# Patient Record
Sex: Male | Born: 1960
Health system: Southern US, Community
[De-identification: ages and names within clinical notes are randomized; demographics above are authoritative.]

## PROBLEM LIST (undated history)

## (undated) DIAGNOSIS — I38 Endocarditis, valve unspecified: Secondary | ICD-10-CM

## (undated) DIAGNOSIS — N200 Calculus of kidney: Secondary | ICD-10-CM

## (undated) DIAGNOSIS — I10 Essential (primary) hypertension: Secondary | ICD-10-CM

## (undated) DIAGNOSIS — E785 Hyperlipidemia, unspecified: Secondary | ICD-10-CM

## (undated) HISTORY — PX: APPENDECTOMY: SHX54

## (undated) HISTORY — DX: Endocarditis, valve unspecified: I38

## (undated) HISTORY — PX: ORIF ZYGOMATIC FRACTURE: SHX2134

## (undated) HISTORY — DX: Essential (primary) hypertension: I10

## (undated) HISTORY — DX: Hyperlipidemia, unspecified: E78.5

## (undated) HISTORY — DX: Calculus of kidney: N20.0

---

## 1998-10-20 ENCOUNTER — Emergency Department (HOSPITAL_COMMUNITY): Admission: EM | Admit: 1998-10-20 | Discharge: 1998-10-21 | Payer: Self-pay

## 1998-10-20 ENCOUNTER — Encounter: Payer: Self-pay | Admitting: Emergency Medicine

## 2003-12-02 ENCOUNTER — Emergency Department (HOSPITAL_COMMUNITY): Admission: EM | Admit: 2003-12-02 | Discharge: 2003-12-02 | Payer: Self-pay | Admitting: Emergency Medicine

## 2009-01-29 ENCOUNTER — Emergency Department: Payer: Self-pay | Admitting: Unknown Physician Specialty

## 2009-02-23 ENCOUNTER — Emergency Department: Payer: Self-pay | Admitting: Emergency Medicine

## 2009-02-26 ENCOUNTER — Emergency Department: Payer: Self-pay | Admitting: Unknown Physician Specialty

## 2009-03-03 ENCOUNTER — Ambulatory Visit: Payer: Self-pay | Admitting: Urology

## 2009-03-06 ENCOUNTER — Emergency Department: Payer: Self-pay | Admitting: Emergency Medicine

## 2011-05-31 ENCOUNTER — Emergency Department: Payer: Self-pay | Admitting: *Deleted

## 2011-05-31 LAB — COMPREHENSIVE METABOLIC PANEL
Albumin: 3.6 g/dL (ref 3.4–5.0)
Alkaline Phosphatase: 77 U/L (ref 50–136)
Anion Gap: 14 (ref 7–16)
BUN: 14 mg/dL (ref 7–18)
Bilirubin,Total: 0.3 mg/dL (ref 0.2–1.0)
Calcium, Total: 9.1 mg/dL (ref 8.5–10.1)
Chloride: 109 mmol/L — ABNORMAL HIGH (ref 98–107)
Co2: 20 mmol/L — ABNORMAL LOW (ref 21–32)
Creatinine: 1.07 mg/dL (ref 0.60–1.30)
EGFR (African American): >60
EGFR (Non-African Amer.): >60
Glucose: 79 mg/dL (ref 65–99)
Osmolality: 284 (ref 275–301)
Potassium: 4 mmol/L (ref 3.5–5.1)
SGOT(AST): 24 U/L (ref 15–37)
SGPT (ALT): 23 U/L
Sodium: 143 mmol/L (ref 136–145)
Total Protein: 7.7 g/dL (ref 6.4–8.2)

## 2011-05-31 LAB — CBC
HCT: 41.8 % (ref 40.0–52.0)
HGB: 14.2 g/dL (ref 13.0–18.0)
MCH: 31.5 pg (ref 26.0–34.0)
MCHC: 34.1 g/dL (ref 32.0–36.0)
MCV: 93 fL (ref 80–100)
Platelet: 187 10*3/uL (ref 150–440)
RBC: 4.51 10*6/uL (ref 4.40–5.90)
RDW: 13.5 % (ref 11.5–14.5)
WBC: 10.9 10*3/uL — ABNORMAL HIGH (ref 3.8–10.6)

## 2011-05-31 LAB — URINALYSIS, COMPLETE
Bacteria: NONE SEEN
Bilirubin,UR: NEGATIVE
Blood: NEGATIVE
Glucose,UR: NEGATIVE mg/dL (ref 0–75)
Ketone: NEGATIVE
Leukocyte Esterase: NEGATIVE
Nitrite: NEGATIVE
Ph: 7 (ref 4.5–8.0)
Protein: NEGATIVE
RBC,UR: <1 /HPF (ref 0–5)
Specific Gravity: 1.013 (ref 1.003–1.030)
Squamous Epithelial: NONE SEEN
WBC UR: <1 /HPF (ref 0–5)

## 2011-05-31 LAB — LIPASE, BLOOD: Lipase: 193 U/L (ref 73–393)

## 2011-07-13 ENCOUNTER — Ambulatory Visit: Payer: BC Managed Care – PPO

## 2011-07-13 ENCOUNTER — Ambulatory Visit (INDEPENDENT_AMBULATORY_CARE_PROVIDER_SITE_OTHER): Payer: BC Managed Care – PPO | Admitting: Family Medicine

## 2011-07-13 VITALS — BP 139/89 | HR 82 | Temp 99.3°F | Resp 18 | Ht 74.0 in | Wt 202.0 lb

## 2011-07-13 DIAGNOSIS — K529 Noninfective gastroenteritis and colitis, unspecified: Secondary | ICD-10-CM

## 2011-07-13 DIAGNOSIS — E860 Dehydration: Secondary | ICD-10-CM

## 2011-07-13 DIAGNOSIS — R197 Diarrhea, unspecified: Secondary | ICD-10-CM

## 2011-07-13 DIAGNOSIS — R112 Nausea with vomiting, unspecified: Secondary | ICD-10-CM

## 2011-07-13 DIAGNOSIS — R1084 Generalized abdominal pain: Secondary | ICD-10-CM

## 2011-07-13 DIAGNOSIS — K5289 Other specified noninfective gastroenteritis and colitis: Secondary | ICD-10-CM

## 2011-07-13 LAB — POCT CBC
Granulocyte percent: 67.2 %G (ref 37–80)
HCT, POC: 44.2 % (ref 43.5–53.7)
Hemoglobin: 14.7 g/dL (ref 14.1–18.1)
Lymph, poc: 1.7 (ref 0.6–3.4)
MCH, POC: 30.1 pg (ref 27–31.2)
MCHC: 33.3 g/dL (ref 31.8–35.4)
MCV: 90.6 fL (ref 80–97)
MID (cbc): 0.5 (ref 0–0.9)
MPV: 10.3 fL (ref 0–99.8)
POC Granulocyte: 4.4 (ref 2–6.9)
POC LYMPH PERCENT: 25.7 %L (ref 10–50)
POC MID %: 7.1 %M (ref 0–12)
Platelet Count, POC: 306 10*3/uL (ref 142–424)
RBC: 4.88 M/uL (ref 4.69–6.13)
RDW, POC: 13.7 %
WBC: 6.5 10*3/uL (ref 4.6–10.2)

## 2011-07-13 MED ORDER — PROMETHAZINE HCL 12.5 MG PO TABS: 12.5000 mg | ORAL_TABLET | Freq: Three times a day (TID) | ORAL | Status: AC | PRN

## 2011-07-13 MED ORDER — SODIUM CHLORIDE 0.9 % IV SOLN: 4.0000 mg | Freq: Once | INTRAVENOUS | Status: DC

## 2011-07-13 NOTE — Progress Notes (Signed)
[facility]:  Office Visit  Chief Complaint:  Chief Complaint  Patient presents with  . Dizziness  . Emesis    started yesterday  . Diarrhea    HPI: Randy [name] is a 51 y.o. male who complains of  1 day acute onset of N/v, nonbloody diarrhea x 4 episodes, nonbilious vomiting, diffuse abd pain. Deneis recent sick contactts, fevers, cough, viral sxs, new foods, new meds, new travels. 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 Denies being around other people with similar sxs.   Past Medical History  Diagnosis Date  . Hyperlipidemia    History reviewed. No pertinent past surgical history. History   Social History  . Marital Status: Married    Spouse Name: N/A    Number of Children: N/A  . Years of Education: N/A   Social History Main Topics  . Smoking status: Never Smoker   . Smokeless tobacco: None  . Alcohol Use: No  . Drug Use: No  . Sexually Active: None   Other Topics Concern  . None   Social History Narrative  . None   Family History  Problem Relation Age of Onset  . Diabetes Mother   . Hypertension Father   . Kidney disease Father    No Known Allergies Prior to Admission medications   Not on File     ROS: The patient denies fevers, chills, night sweats, unintentional weight loss, chest pain, palpitations, wheezing, dyspnea on exertion, dysuria, hematuria, melena, numbness, weakness, or tingling. + diarrhea+ nausea, +vomiting, +abdominal pain,  All other systems have been reviewed and were otherwise negative with the exception of those mentioned in the HPI and as above.    PHYSICAL EXAM: Filed Vitals:   07/13/11 0926  BP: 139/89  Pulse: 82  Temp: 99.3 F (37.4 C)  Resp: 18   Filed Vitals:   07/13/11 0926  Height: 6\' 2"  (1.88 m)  Weight: 202 lb (91.627 kg)   Body mass index is 25.94 kg/(m^2).  General: Alert, no acute distress HEENT:  Normocephalic, atraumatic, oropharynx patent. TM nl, + dry oral mucosa, erythematous  throat Cardiovascular:  Regular rate and rhythm, no rubs murmurs or gallops.  No Carotid bruits, radial pulse intact. No pedal edema.  Respiratory: Clear to auscultation bilaterally.  No wheezes, rales, or rhonchi.  No cyanosis, no use of accessory musculature GI: No organomegaly, abdomen is soft and non-tender, positive bowel sounds.  No masses. No guarding Skin: No rashes. Neurologic: Facial musculature symmetric. Psychiatric: Patient is appropriate throughout our interaction. Lymphatic: No cervical lymphadenopathy Musculoskeletal: Gait intact.   LABS: Results for orders placed in visit on 07/13/11  POCT CBC      Component Value Range   WBC 6.5  4.6 - 10.2 (K/uL)   Lymph, poc 1.7  0.6 - 3.4    POC LYMPH PERCENT 25.7  10 - 50 (%L)   MID (cbc) 0.5  0 - 0.9    POC MID % 7.1  0 - 12 (%M)   POC Granulocyte 4.4  2 - 6.9    Granulocyte percent 67.2  37 - 80 (%G)   RBC 4.88  4.69 - 6.13 (M/uL)   Hemoglobin 14.7  14.1 - 18.1 (g/dL)   HCT, POC 78.2  95.6 - 53.7 (%)   MCV 90.6  80 - 97 (fL)   MCH, POC 30.1  27 - 31.2 (pg)   MCHC 33.3  31.8 - 35.4 (g/dL)   RDW, POC 21.3     Platelet Count,  POC 306  142 - 424 (K/uL)   MPV 10.3  0 - 99.8 (fL)     EKG/XRAY:   Primary read interpreted by Dr. Conley Rolls at [facility]. Non specific gas patterns. No free air   ASSESSMENT/PLAN: Encounter Diagnoses  Name Primary?  . Gastroenteritis Yes  . Abdominal pain   . Nausea & vomiting   . Dehydration   . Diarrhea    Most likely viral gastroenteritis. Felt better with IVF and Zofran, Normal CBC and  normal Abd xray. CMP pending. Patient would also like referral to GI for screening colonoscopy since overdue. Jaliza Seifried PHUONG, DO 07/13/2011 11:46 AM

## 2011-07-14 LAB — COMPREHENSIVE METABOLIC PANEL
ALT: 18 U/L (ref 0–53)
AST: 33 U/L (ref 0–37)
Albumin: 4.4 g/dL (ref 3.5–5.2)
Alkaline Phosphatase: 90 U/L (ref 39–117)
BUN: 12 mg/dL (ref 6–23)
CO2: 24 mEq/L (ref 19–32)
Calcium: 9.7 mg/dL (ref 8.4–10.5)
Chloride: 102 mEq/L (ref 96–112)
Creat: 0.9 mg/dL (ref 0.50–1.35)
Glucose, Bld: 91 mg/dL (ref 70–99)
Potassium: 4.9 mEq/L (ref 3.5–5.3)
Sodium: 136 mEq/L (ref 135–145)
Total Bilirubin: 0.7 mg/dL (ref 0.3–1.2)
Total Protein: 7.8 g/dL (ref 6.0–8.3)

## 2011-07-17 ENCOUNTER — Encounter: Payer: Self-pay | Admitting: Gastroenterology

## 2011-08-10 ENCOUNTER — Encounter: Payer: Self-pay | Admitting: *Deleted

## 2011-08-10 ENCOUNTER — Telehealth: Payer: Self-pay | Admitting: *Deleted

## 2011-08-10 NOTE — Telephone Encounter (Signed)
Left message on home phone to call back to [facility] previsit.  Otherwise he will need to [facility] his colon also

## 2011-08-22 ENCOUNTER — Encounter: Payer: Self-pay | Admitting: Gastroenterology

## 2012-11-10 ENCOUNTER — Ambulatory Visit (INDEPENDENT_AMBULATORY_CARE_PROVIDER_SITE_OTHER): Payer: BC Managed Care – PPO | Admitting: Family Medicine

## 2012-11-10 ENCOUNTER — Encounter: Payer: Self-pay | Admitting: Family Medicine

## 2012-11-10 ENCOUNTER — Other Ambulatory Visit: Payer: Self-pay | Admitting: Family Medicine

## 2012-11-10 VITALS — BP 164/88 | HR 86 | Temp 98.4°F | Resp 16 | Ht 74.0 in | Wt 199.0 lb

## 2012-11-10 DIAGNOSIS — R079 Chest pain, unspecified: Secondary | ICD-10-CM

## 2012-11-10 DIAGNOSIS — K219 Gastro-esophageal reflux disease without esophagitis: Secondary | ICD-10-CM

## 2012-11-10 DIAGNOSIS — Z833 Family history of diabetes mellitus: Secondary | ICD-10-CM

## 2012-11-10 DIAGNOSIS — M542 Cervicalgia: Secondary | ICD-10-CM

## 2012-11-10 DIAGNOSIS — R1032 Left lower quadrant pain: Secondary | ICD-10-CM

## 2012-11-10 LAB — COMPLETE METABOLIC PANEL WITH GFR
ALT: 16 U/L (ref 0–53)
AST: 15 U/L (ref 0–37)
Albumin: 4.4 g/dL (ref 3.5–5.2)
Alkaline Phosphatase: 98 U/L (ref 39–117)
BUN: 13 mg/dL (ref 6–23)
CO2: 26 mEq/L (ref 19–32)
Calcium: 10.3 mg/dL (ref 8.4–10.5)
Chloride: 104 mEq/L (ref 96–112)
Creat: 1.08 mg/dL (ref 0.50–1.35)
GFR, Est African American: >89 mL/min
GFR, Est Non African American: 89 mL/min
Glucose, Bld: 104 mg/dL — ABNORMAL HIGH (ref 70–99)
Potassium: 4.1 mEq/L (ref 3.5–5.3)
Sodium: 137 mEq/L (ref 135–145)
Total Bilirubin: 0.6 mg/dL (ref 0.3–1.2)
Total Protein: 8.1 g/dL (ref 6.0–8.3)

## 2012-11-10 LAB — LIPID PANEL
Cholesterol: 295 mg/dL — ABNORMAL HIGH (ref 0–200)
HDL: 37 mg/dL — ABNORMAL LOW (ref >39)
LDL Cholesterol: 210 mg/dL — ABNORMAL HIGH (ref 0–99)
Total CHOL/HDL Ratio: 8 Ratio
Triglycerides: 239 mg/dL — ABNORMAL HIGH (ref <150)
VLDL: 48 mg/dL — ABNORMAL HIGH (ref 0–40)

## 2012-11-10 MED ORDER — OMEPRAZOLE 40 MG PO CPDR: 40.0000 mg | DELAYED_RELEASE_CAPSULE | Freq: Every day | ORAL | Status: DC

## 2012-11-10 MED ORDER — METHOCARBAMOL 500 MG PO TABS: 500.0000 mg | ORAL_TABLET | Freq: Four times a day (QID) | ORAL | Status: DC

## 2012-11-10 NOTE — Patient Instructions (Addendum)
Referral has been made to the cardiologist  Take them at omeprazole 1 daily one hour before eating  Use the muscle relaxant 1 pill 3 times daily as needed for the neck.  Return if worse or make a routine followup in 2 months to see me

## 2012-11-10 NOTE — Progress Notes (Signed)
Subjective: 52 year old man known to me who is here with several complaints. For the past few weeks she's been having intermittent problems with abdominal bloating and discomfort with pain up into his chest and toward his shoulders. He has been reluctant to do hard vigorous exercise for fear of his heart. He does have a history of rheumatic fever in childhood, and was told that there might be a little something going on his valves for his years ago when he saw the cardiologist. He has not been back to a cardiologist. He works regularly. He's been having pain in both sides of his neck when he turns it slightly one way or the other, more on the right. He's been grieving and eating okay. He has not eaten today. Is not on any regular medications. Family history of diabetes and hypertension  Objective Pleasant alert healthy appearing man in no major distress. Neck supple with mild tenderness of the lower end of the sternocleidomastoid muscles. Chest is clear to auscultation. Heart regular without murmurs gallops or arrhythmias. And soft the mass or tenderness. He feels bloated.  EKG no acute changes  CBC normal  Hemoglobin A1c normal  Assessment: Chest pain, etiology unclear GERD and abdominal bloating Anxiety Cervical pain  Plan: Robaxin for the neck muscles Omeprazole for his stomach and bloating  referred to cardiologist Lipids and C. met are pending. Depending on results will decide on medication treatment.

## 2012-11-12 ENCOUNTER — Other Ambulatory Visit: Payer: Self-pay | Admitting: *Deleted

## 2012-11-12 MED ORDER — PRAVASTATIN SODIUM 40 MG PO TABS: 40.0000 mg | ORAL_TABLET | Freq: Every day | ORAL | Status: DC

## 2012-11-14 ENCOUNTER — Encounter: Payer: Self-pay | Admitting: Cardiology

## 2012-11-14 NOTE — Progress Notes (Signed)
Patient ID: Randy [name], male   DOB: [birth year], 52 y.o.   MRN: [id number]   Randy [name], Randy [name]  Date of visit:  11/14/2012 DOB:  06/09/1960    Age:  51 yrs. Medical record number:  [id number]     Account number:  [id number] Primary Care Provider: HOPPER,DAVID ____________________________ CURRENT DIAGNOSES  1. Abnormal EKG  2. Chest Pain  3. Hyperlipidemia  4. Hypertension,Essential (Benign)  5. GERD ____________________________ ALLERGIES  Sulfa (Sulfonamides), Itching of skin ____________________________ MEDICATIONS  1. esomeprazole magnesium 40 mg capsule,delayed release(DR/EC), 1 p.o. daily  2. methocarbamol 500 mg tablet, QID  3. lovastatin 20 mg tablet, 1 p.o. daily ____________________________ CHIEF COMPLAINTS  Chest pain ____________________________ HISTORY OF PRESENT ILLNESS  History nice 52 year old black male is seen at the request of Dr. Alwyn Ren for evaluation of chest pain. He was seen by me about 5 years ago and had hyperlipidemia and borderline blood pressure. HEENT no chest pain at that time and had [name] negative treadmill. He had some septal Q waves on EKG. He recently developed chest discomfort over the past 3 weeks. He notes midsternal discomfort and burning type pain did radiate through to his back that will occur at rest or at various times during the day. It is sometimes worse if he lifts things but is not associated with walking or activity. In addition he has had [name] lot of belching, indigestion and generalized abdominal discomfort when he also has this.  He was placed on Prilosec and I was asked to see him. He has [name] history of hyperlipidemia and briefly took statins but stopped taking them when he followed [name] better diet and was exercising. On [name] recent visit he was found to have significant hyperlipidemia again. .____________________________ PAST HISTORY  Past Medical Illnesses:  hyperlipidemia, hypertension, GERD, rheumatic fever;  Cardiovascular Illnesses:  no previous  history of cardiac disease.;  Surgical Procedures:  appendectomy, rt cheek, lithotripsy;  Cardiology Procedures-Invasive:  no history of prior cardiac procedures;  Cardiology Procedures-Noninvasive:  echocardiogram July 2009, treadmill July 2009;  LVEF of 65% documented via echocardiogram on 05/15/2007,   ____________________________ CARDIO-PULMONARY TEST DATES EKG Date:  11/14/2012;  Echocardiography Date: 11/12/2007;   ____________________________ FAMILY HISTORY Brother -- Brother alive and well Brother -- Brother alive and well Brother -- Hypertension Father -- Hypertension, kidney disease Mother -- Diabetes mellitus Sister -- Sister alive and well ____________________________ SOCIAL HISTORY Alcohol Use:  no alcohol use;  Smoking:  never smoked;  Diet:  regular diet;  Lifestyle:  married;  Exercise:  no regular exercise;  Occupation:  Technical sales engineer;  Residence:  lives with wife and daughter;   ____________________________ REVIEW OF SYSTEMS General:  denies recent weight change, fatique or change in exercise tolerance.  Integumentary:no rashes or lesions. Eyes: denies diplopia, history of glaucoma or visual problems. Ears, Nose, Throat, Mouth:  denies any hearing loss, epistaxis, hoarseness or difficulty speaking. Respiratory: denies dyspnea, cough, wheezing or hemoptysis. Cardiovascular:  please review HPI Abdominal: dyspepsia Genitourinary-Male: erectile dysfunction  Musculoskeletal:  denies arthritis, venous insufficiency, or muscle weakness. Neurological:  denies headaches, stroke, or TIA  ____________________________ PHYSICAL EXAMINATION VITAL SIGNS  Blood Pressure:  164/94 Sitting, Right arm, regular cuff   Pulse:  78/min. Weight:  200.00 lbs. Height:  74"BMI: 25  Constitutional:  pleasant African Americian male in no acute distress Skin:  warm and dry to touch, no apparent skin lesions, or masses noted. Head:  normocephalic, normal hair pattern, no masses or  tenderness Eyes:  EOMS  Intact, PERRLA, C and S clear, Funduscopic exam not done. ENT:  ears, nose and throat reveal no gross abnormalities.  Dentition good. Neck:  supple, without massess. No JVD, thyromegaly or carotid bruits. Carotid upstroke normal. Chest:  normal symmetry, clear to auscultation and percussion. Cardiac:  regular rhythm, normal S1 and S2, no S3 or S4, grade 1/6 systolic murmur Abdomen:  abdomen soft,non-tender, no masses, no hepatospenomegaly, or aneurysm noted Peripheral Pulses:  the femoral,dorsalis pedis, and posterior tibial pulses are full and equal bilaterally with no bruits auscultated. Extremities & Back:  no deformities, clubbing, cyanosis, erythema or edema observed. Normal muscle strength and tone. Neurological:  no gross motor or sensory deficits noted, affect appropriate, oriented x3. ____________________________ MOST RECENT LIPID PANEL 11/10/12  CHOL TOTL 295 mg/dl, LDL 409 calc, HDL 37 mg/dl, TRIGLYCER 811 mg/dl and CHOL/HDL 8.0 (Calc) ____________________________ IMPRESSIONS/PLAN  1. Chest discomfort with some atypical features in [name] patient with several risk factors 2. Hyperlipidemia 3. Elevation of blood pressure without previous diagnosis of hypertension  Recommendations:  Labs from Dr. Frederik Pear office was reviewed. I will start with an exercise treadmill in him. Echocardiogram from 2009 showed trivial aortic regurgitation. He has [name] soft systolic murmur today.   EKG is normal. ____________________________ TODAYS ORDERS  1. treadmill:  Regular TM First Available  2. 12 Lead EKG: Today                       ____________________________ Cardiology Physician:  Darden Palmer. MD [facility]

## 2012-11-20 ENCOUNTER — Encounter: Payer: Self-pay | Admitting: Cardiology

## 2012-11-20 NOTE — Progress Notes (Signed)
Patient ID: Randy [name], male   DOB: 1960-06-27, 52 y.o.   MRN: [id number]   Randy, Antunes [name]  Date of visit:  11/20/2012 DOB:  [birth year]    Age:  51 yrs. Medical record number:  [id number]     Account number:  [id number] Primary Care Provider: HOPPER,DAVID  CURRENT DIAGNOSES  1. Abnormal EKG  2. Chest Pain  3. Hyperlipidemia  4. Hypertension,Essential (Benign)  5. GERD  6. Aortic Valve Disorder  TREADMILL  The patient exercised on the standard Bruce protocol [name] total of 9:30 minutes into Bruce stage 4 achieving  [name] workload of  11 METS. The test was stopped due to dyspnea and fatigue. The patient had no chest pain suggestive of angina. Heart rate rose to 172 which was 102% of predicted maximum. Blood pressure response was normal.  12-lead EKG shows [name] possible old anteroseptal MI at rest. With exercise the patient achieved target heart rate and had no ST segment depression consistent with ischemia. No arrhythmias occurred.  IMPRESSIONS:  1. Clinically and EKG negative for ischemia 2. Good exercise capacity  Recommendations:  The patient has [name] negative adequate exercise treadmill test with no significant ST segment depression consistent with ischemia. He has [name] prior history of mild aortic regurgitation. He will have another echocardiogram to assess this. From [name] cardiovascular viewpoint he may be involved in [name] regular exercise program. He states that his stomach issues are better. His blood pressure was again elevated today and I recommended that he begin taking triamterene hydrochlorothiazide on [name] daily basis. He should also continue to monitor his blood pressures.   TODAYS ORDERS  1. 2D, color flow, doppler: First Available                        Cardiology Physician:  Darden Palmer. MD [facility]

## 2013-01-29 ENCOUNTER — Encounter: Payer: Self-pay | Admitting: Family Medicine

## 2013-02-11 ENCOUNTER — Other Ambulatory Visit: Payer: Self-pay | Admitting: Family Medicine

## 2013-04-22 ENCOUNTER — Other Ambulatory Visit: Payer: Self-pay | Admitting: Family Medicine

## 2014-04-10 ENCOUNTER — Emergency Department (HOSPITAL_COMMUNITY): Payer: BC Managed Care – PPO

## 2014-04-10 ENCOUNTER — Encounter (HOSPITAL_COMMUNITY): Payer: Self-pay | Admitting: *Deleted

## 2014-04-10 ENCOUNTER — Emergency Department (HOSPITAL_COMMUNITY)
Admission: EM | Admit: 2014-04-10 | Discharge: 2014-04-10 | Disposition: A | Discharge status: Home / Self Care | Payer: BC Managed Care – PPO | Attending: Emergency Medicine | Admitting: Emergency Medicine

## 2014-04-10 DIAGNOSIS — Y92410 Unspecified street and highway as the place of occurrence of the external cause: Secondary | ICD-10-CM | POA: Diagnosis not present

## 2014-04-10 DIAGNOSIS — S3992XA Unspecified injury of lower back, initial encounter: Secondary | ICD-10-CM | POA: Diagnosis present

## 2014-04-10 DIAGNOSIS — Z79899 Other long term (current) drug therapy: Secondary | ICD-10-CM | POA: Insufficient documentation

## 2014-04-10 DIAGNOSIS — E785 Hyperlipidemia, unspecified: Secondary | ICD-10-CM | POA: Diagnosis not present

## 2014-04-10 DIAGNOSIS — Y998 Other external cause status: Secondary | ICD-10-CM | POA: Diagnosis not present

## 2014-04-10 DIAGNOSIS — Y9389 Activity, other specified: Secondary | ICD-10-CM | POA: Insufficient documentation

## 2014-04-10 DIAGNOSIS — M545 Low back pain: Secondary | ICD-10-CM

## 2014-04-10 MED ORDER — HYDROCODONE-ACETAMINOPHEN 5-325 MG PO TABS: ORAL_TABLET | ORAL | Status: DC

## 2014-04-10 MED ORDER — METHOCARBAMOL 500 MG PO TABS: 1000.0000 mg | ORAL_TABLET | Freq: Four times a day (QID) | ORAL | Status: DC | PRN

## 2014-04-10 MED ORDER — ONDANSETRON 8 MG PO TBDP
8.0000 mg | ORAL_TABLET | Freq: Once | ORAL | Status: AC
Start: 2014-04-10 — End: 2014-04-10
  Administered 2014-04-10: 8 mg via ORAL
  Filled 2014-04-10: qty 1

## 2014-04-10 MED ORDER — NAPROXEN 250 MG PO TABS: 250.0000 mg | ORAL_TABLET | Freq: Two times a day (BID) | ORAL | Status: DC | PRN

## 2014-04-10 NOTE — Discharge Instructions (Signed)
°Emergency Department Resource Guide °1) Find a Doctor and Pay Out of Pocket °Although you won't have to find out who is covered by your insurance plan, it is a good idea to ask around and get recommendations. You will then need to call the office and see if the doctor you have chosen will accept you as a new patient and what types of options they offer for patients who are self-pay. Some doctors offer discounts or will set up payment plans for their patients who do not have insurance, but you will need to ask so you aren't surprised when you get to your appointment. ° °2) Contact Your Local Health Department °Not all health departments have doctors that can see patients for sick visits, but many do, so it is worth a call to see if yours does. If you don't know where your local health department is, you can check in your phone book. The CDC also has a tool to help you locate your state's health department, and many state websites also have listings of all of their local health departments. ° °3) Find a Walk-in Clinic °If your illness is not likely to be very severe or complicated, you may want to try a walk in clinic. These are popping up all over the country in pharmacies, drugstores, and shopping centers. They're usually staffed by nurse practitioners or physician assistants that have been trained to treat common illnesses and complaints. They're usually fairly quick and inexpensive. However, if you have serious medical issues or chronic medical problems, these are probably not your best option. ° °No Primary Care Doctor: °- Call [facility] at  832-8000 - they can help you locate a primary care doctor that  accepts your insurance, provides certain services, etc. °- Physician Referral Service- 1-800-533-3463 ° °Chronic Pain Problems: °Organization         Address  Phone   Notes  °[facility]  (336) 297-2271 Patients need to be referred by their primary care doctor.  ° °Medication  Assistance: °Organization         Address  Phone   Notes  °[place] [facility] [address] °[place], [place] [address] (336) 641-8030 --Must be a resident of [place] °-- Must have NO insurance coverage whatsoever (no Medicaid/ Medicare, etc.) °-- The pt. MUST have a primary care doctor that directs their care regularly and follows them in the community °  °[facility]  (866) 331-1348   °[employer]  (888) 892-1162   ° °Agencies that provide inexpensive medical care: °Organization         Address  Phone   Notes  °[facility]  (336) 832-8035   °[facility]    (336) 832-7272   °[facility] [address] °[place], [place] [address] (336) 832-4777   °[facility] [address], °[place] (336) 271-4999   °[facility]    (336) 373-0678   °[facility]    (336) 272-1050   °[facility] ° [address], [place] Phone:  (336) 832-4444, Fax:  (336) 832-4440 Hours of Operation:  9 am - 6 pm, M-F.  Also accepts Medicaid/Medicare and self-pay.  °[facility] ° [address],  Phone: (336) 832-3150, Fax: (336) 832-3151. Hours of Operation:  8:30 am - 5:30 pm, M-F.  Also accepts Medicaid and self-pay.  °[facility] [place] [address], [place] Phone: (336) 878-6027   °[facility] [address], [place], [place] (336)723-1848, Ext. 123 Mondays & Thursdays: 7-9 AM.  First 15 patients are seen on a first come, first serve basis. °  ° °Medicaid-accepting [place] Providers: ° °Organization         Address  Phone   Notes  °[facility] [address], [place] (336) 641-2100 Also accepts self-pay patients.  °[facility] [address], [place] ° (336) 856-9996   °[facility] [address], [place]  (336) 288-8857   °[facility] [address] [place] [address], [place] (336) 299-7000   °Veita Bland [address], [place]  ° (336) 373-1557 Only accepts [place] Access Medicaid patients after they have their name applied to their card.  ° °Self-Pay (no insurance) in [place]: ° °Organization         Address  Phone   Notes  °Sickle Cell Patients, [facility] [address], [place] (336) 832-1970   °[facility] [address], [place] (336) 832-4400   °[facility] ° [address], [place] (336) 992-4800   °[facility]/Dr. Osei-Bonsu ° [address] [place] [address], [place] or [address], [place] (336) 841-8500 Phone number for both [place] and [place] locations is the same.  °[facility] [address], [place] (336) 299-0000   °[facility] [address] [place] [address], [place] or [address] (336) 852-7530 °(336) 878-2260   °[facility] [address], [place] (336) 350-1642, phone; (336) 294-5005, fax Sees patients 1st and 3rd Saturday of every month.  Must not qualify for public or private insurance (i.e. Medicaid, Medicare, [place] Health Choice, Veterans' Benefits) • Household income should be no more than 200% of the poverty level •The clinic cannot treat you if you are pregnant or think you are pregnant • Sexually transmitted diseases are not treated at the clinic.  ° ° °Dental Care: °Organization         Address  Phone  Notes  °[place] [facility] [address],  (336) 641-6152 Accepts children up to age 21 who are enrolled in Medicaid or [place] Health Choice; pregnant women with a Medicaid card; and children who have applied for Medicaid or [place] Health Choice, but were declined, whose parents can pay a reduced fee at time of service.  °[place]  [facility] [place]  [address], [place] (336) 641-7733 Accepts children up to age 21 who are enrolled in Medicaid or [place] Health Choice; pregnant women with a Medicaid card; and children who have applied for Medicaid or [place] Health Choice, but were declined, whose parents can pay a reduced fee at time of service.  °[facility] ° [address],  (336) 641-4533 Patients are seen by appointment only. Walk-ins are not accepted. [facility] will see patients 18 years of age and older. °Monday - Tuesday (8am-5pm) °Most Wednesdays (8:30-5pm) °$30 per visit, cash only  °[facility] ° [address], [place] (336) 641-4533 Patients are seen by appointment only. Walk-ins are not accepted. [facility] will see patients 18 years of age and older. °One   Wednesday Evening (Monthly: Volunteer Based).  $30 per visit, cash only  °[facility]  (919) 537-3737 for adults; Children under age 4, call Graduate Pediatric Dentistry at ([phone]) [phone]. Children aged 4-14, please call (919) [phone] to request a pediatric application. ° Dental services are provided in all areas of dental care including fillings, crowns and bridges, complete and partial dentures, implants, gum treatment, root canals, and extractions. Preventive care is also provided. Treatment is provided to both adults and children. °Patients are selected via a lottery and there is often a waiting list. °  °[facility] [address], °[place] ° (336) 763-8833 [email or web address] °  °[facility] [address], [place], [place] (336)723-1848, Ext. 123 Second and Fourth Thursday of each month, opens at 6:30 AM; Clinic ends at 9 AM.  Patients are seen on a first-come first-served basis, and a limited number are seen during each clinic.  ° °[facility] ° [address], [place], [place] (336) 723-7904    Eligibility Requirements °You must have lived in [place], [place], or [place] counties for at least the last three months. °  You cannot be eligible for state or federal sponsored healthcare insurance, including Veterans Administration, Medicaid, or Medicare. °  You generally cannot be eligible for healthcare insurance through your employer.  °  How to apply: °Eligibility screenings are held every Tuesday and Wednesday afternoon from 1:00 pm until 4:00 pm. You do not need an appointment for the interview!  °[facility] [address], [place], [place] 336-631-2330   °[facility]  336-342-8273   °[facility]  336-703-3100   °[facility]  336-570-6415   ° °Behavioral Health Resources in the Community: °Intensive Outpatient Programs °Organization         Address  Phone  Notes  °[facility] [address], [place], [place] 336-878-6098   °[facility] [address], [place], [place] 336-832-9800   °[facility] [address], [place], [place] ° 336-882-2125   °[facility] [address],  °[place], [place] 1-800-853-5163 or 336-641-4981   °Substance Abuse Resources °Organization         Address  Phone  Notes  °[facility]  336-882-2125   °[facility]  336-784-9470   °[facility]  336-285-9073   °[facility]  336-845-3988   °Residential & Outpatient Substance Abuse Program  1-800-659-3381   °Psychological Services °Organization         Address  Phone  Notes  °[facility]  336- 832-9600   °[facility]  336- 378-7881   °[facility] [address], [place] 1-800-853-5163 or 336-641-4981   ° °Mobile Crisis Teams °Organization         Address  Phone  Notes  °[facility], Mobile Crisis Care Unit  1-877-626-1772   °Assertive °[facility] ° [address]  [place], [place] 336-834-9664   °Sharon DeEsch [address] °[place] 336-554-5454   ° °Self-Help/Support Groups °Organization         Address  Phone             Notes  °[facility]  - variety of support groups  336- 373-1402 Call for more information  °Narcotics Anonymous (NA), [facility] [address], °[place]  2 meetings at this location  ° °  Residential Treatment Programs °Organization         Address  Phone  Notes  °[facility] [address],    °[place]  1-866-801-8205   °[facility] ° [address], [place], [place] 704-293-8524   °[facility] [address], [place] 336-845-3988 Admissions: 8am-3pm M-F  °[facility] [address]    °[place], [place] 336-841-1104   °[facility] [address], [place], [place] 336-379-7146   °[facility] [address]  °[place], [place] 336-285-9073   °[facility] Intensive Outpatient [address], [place], [place] 336-852-3033   °[facility] [address]  °[place], [place] 1-877-615-2722 or 336-784-9470   °[facility] [address] [place], [place] 336-227-7417 Accepts Medicaid  °[facility] [address]  °[place] 1-800-659-3381 Substance Abuse/Addiction Treatment  ° °[place] Behavioral Health Resources °Organization         Address  Phone  Notes  °[facility]  (888) 581-9988   °Julie Brannon, PhD [address] [place], [place]   (336) 349-5553 or (336) 951-0000   °[facility]   [address] °[place], [place] (336) 349-4454   °[facility] [address], [place], [place] (336) 342-8316 Insurance/Medicaid/sponsorship through [facility]  °[facility] [address]                                    [place], [place] (336) 342-8316 Therapy/tele-psych/case    °[facility] [address]  ° [place], [place] (336) 349-2233    °Dr. Arfeen  (336) 349-4544   °[facility] [place]  [employer] [place] [facility] 1) [address], [place] °2) [address], [place] °3)  [address], [place] (336) 349-3220 °(336) 342-7768 ° °(336) 342-8140   °[place] [facility] (336) 342-1394 or (336) 342-3537 (After Hours)    ° ° ° °Take the prescriptions as directed.  Apply moist heat or ice to the area(s) of discomfort, for 15 minutes at a time, several times per day for the next few days.  Do not fall asleep on a heating or ice pack.  Call your regular medical doctor on Monday to schedule a follow up appointment in the next 3 days.  Return to the Emergency Department immediately if worsening. ° °
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
## 2014-04-20 ENCOUNTER — Ambulatory Visit (INDEPENDENT_AMBULATORY_CARE_PROVIDER_SITE_OTHER): Payer: BC Managed Care – PPO | Admitting: Internal Medicine

## 2014-04-20 VITALS — BP 124/84 | HR 90 | Temp 98.3°F | Resp 16 | Ht 74.0 in | Wt 207.4 lb

## 2014-04-20 DIAGNOSIS — S161XXS Strain of muscle, fascia and tendon at neck level, sequela: Secondary | ICD-10-CM

## 2014-04-20 DIAGNOSIS — S338XXS Sprain of other parts of lumbar spine and pelvis, sequela: Secondary | ICD-10-CM

## 2014-04-20 DIAGNOSIS — S39012S Strain of muscle, fascia and tendon of lower back, sequela: Secondary | ICD-10-CM

## 2014-04-20 MED ORDER — CYCLOBENZAPRINE HCL 10 MG PO TABS: 10.0000 mg | ORAL_TABLET | Freq: Three times a day (TID) | ORAL | Status: DC | PRN

## 2014-04-20 MED ORDER — MELOXICAM 15 MG PO TABS: 15.0000 mg | ORAL_TABLET | Freq: Every day | ORAL | Status: DC

## 2014-04-20 NOTE — Patient Instructions (Addendum)
Do neck and back stretching exercises 2 or 3 times a day over the next few weeks Use the Flexeril specifically at bedtime. This muscle relaxer will will make you sleepy during the daytime and you can use it but only if you can afford to be lying around without having to drive somewhere If you are not responding to treatment within 1 week call us and we will schedule physical therapyBack Exercises Back exercises help treat and prevent back injuries. The goal is to increase your strength in your belly (abdominal) and back muscles. These exercises can also help with flexibility. Start these exercises when told by your doctor. HOME CARE Back exercises include: Pelvic Tilt.  Lie on your back with your knees bent. Tilt your pelvis until the lower part of your back is against the floor. Hold this position 5 to 10 sec. Repeat this exercise 5 to 10 times. Knee to Chest.  Pull 1 knee up against your chest and hold for 20 to 30 seconds. Repeat this with the other knee. This may be done with the other leg straight or bent, whichever feels better. Then, pull both knees up against your chest. Sit-Ups or Curl-Ups.  Bend your knees 90 degrees. Start with tilting your pelvis, and do a partial, slow sit-up. Only lift your upper half 30 to 45 degrees off the floor. Take at least 2 to 3 seonds for each sit-up. Do not do sit-ups with your knees out straight. If partial sit-ups are difficult, simply do the above but with only tightening your belly (abdominal) muscles and holding it as told. Hip-Lift.  Lie on your back with your knees flexed 90 degrees. Push down with your feet and shoulders as you raise your hips 2 inches off the floor. Hold for 10 seconds, repeat 5 to 10 times. Back Arches.  Lie on your stomach. Prop yourself up on bent elbows. Slowly press on your hands, causing an arch in your low back. Repeat 3 to 5 times. Shoulder-Lifts.  Lie face down with arms beside your body. Keep hips and belly pressed to  floor as you slowly lift your head and shoulders off the floor. Do not overdo your exercises. Be careful in the beginning. Exercises may cause you some mild back discomfort. If the pain lasts for more than 15 minutes, stop the exercises until you see your doctor. Improvement with exercise for back problems is slow.  Document Released: 05/12/2010 Document Revised: 07/02/2011 Document Reviewed: 02/08/2011 [employer] Patient Information 2015 [place], [place]. This information is not intended to replace advice given to you by your health care provider. Make sure you discuss any questions you have with your health care provider.
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
## 2014-11-11 DIAGNOSIS — Z0271 Encounter for disability determination: Secondary | ICD-10-CM

## 2015-02-11 ENCOUNTER — Ambulatory Visit (INDEPENDENT_AMBULATORY_CARE_PROVIDER_SITE_OTHER): Payer: BLUE CROSS/BLUE SHIELD | Admitting: Physician Assistant

## 2015-02-11 VITALS — BP 158/90 | HR 85 | Temp 98.5°F | Resp 17 | Ht 73.5 in | Wt 191.0 lb

## 2015-02-11 DIAGNOSIS — I10 Essential (primary) hypertension: Secondary | ICD-10-CM | POA: Diagnosis not present

## 2015-02-11 DIAGNOSIS — E785 Hyperlipidemia, unspecified: Secondary | ICD-10-CM

## 2015-02-11 LAB — LIPID PANEL
Cholesterol: 258 mg/dL — ABNORMAL HIGH (ref 125–200)
HDL: 36 mg/dL — ABNORMAL LOW (ref >=40)
LDL Cholesterol: 188 mg/dL — ABNORMAL HIGH (ref <130)
Total CHOL/HDL Ratio: 7.2 Ratio — ABNORMAL HIGH (ref <=5.0)
Triglycerides: 171 mg/dL — ABNORMAL HIGH (ref <150)
VLDL: 34 mg/dL — ABNORMAL HIGH (ref <30)

## 2015-02-11 LAB — COMPLETE METABOLIC PANEL WITH GFR
ALT: 14 U/L (ref 9–46)
AST: 16 U/L (ref 10–35)
Albumin: 4.2 g/dL (ref 3.6–5.1)
Alkaline Phosphatase: 94 U/L (ref 40–115)
BUN: 15 mg/dL (ref 7–25)
CO2: 25 mmol/L (ref 20–31)
Calcium: 9.7 mg/dL (ref 8.6–10.3)
Chloride: 104 mmol/L (ref 98–110)
Creat: 0.98 mg/dL (ref 0.70–1.33)
GFR, Est African American: >89 mL/min (ref >=60)
GFR, Est Non African American: 86 mL/min (ref >=60)
Glucose, Bld: 96 mg/dL (ref 65–99)
Potassium: 4.4 mmol/L (ref 3.5–5.3)
Sodium: 138 mmol/L (ref 135–146)
Total Bilirubin: 0.7 mg/dL (ref 0.2–1.2)
Total Protein: 7.8 g/dL (ref 6.1–8.1)

## 2015-02-11 MED ORDER — AMLODIPINE BESYLATE 5 MG PO TABS: 5.0000 mg | ORAL_TABLET | Freq: Every day | ORAL | Status: DC

## 2015-02-11 NOTE — Patient Instructions (Signed)
Please attempt to restrain from fast food as much as possible.  Avoid canned foods.  All are very high in salt.  You can soak the green beans in fresh water to get the salt off if you still have canned goods to eat.  Avoid snacking on honeybuns, and snack machines, and more in favor of granola bars, fresh fruits, or fresh vegetables with a dip (peanut butter).   Please limit your soda intake, and artifical juices. Below are some helpful hints to your diet management. Please check your blood pressure twice per week.   If you feel dizziness, fatigue--you need to let me know.  Please try to exercise 4 times per week for 30 minutes of constant movement. Please have 64 oz of water per day which is almost 4 regular sized water bottles.  I will have your results within the next 10 days.   DASH Eating Plan DASH stands for "Dietary Approaches to Stop Hypertension." The DASH eating plan is a healthy eating plan that has been shown to reduce high blood pressure (hypertension). Additional health benefits may include reducing the risk of type 2 diabetes mellitus, heart disease, and stroke. The DASH eating plan may also help with weight loss. WHAT DO I NEED TO KNOW ABOUT THE DASH EATING PLAN? For the DASH eating plan, you will follow these general guidelines:  Choose foods with a percent daily value for sodium of less than 5% (as listed on the food label).  Use salt-free seasonings or herbs instead of table salt or sea salt.  Check with your health care provider or pharmacist before using salt substitutes.  Eat lower-sodium products, often labeled as "lower sodium" or "no salt added."  Eat fresh foods.  Eat more vegetables, fruits, and low-fat dairy products.  Choose whole grains. Look for the word "whole" as the first word in the ingredient list.  Choose fish and skinless chicken or [place] more often than red meat. Limit fish, poultry, and meat to 6 oz (170 g) each day.  Limit sweets, desserts,  sugars, and sugary drinks.  Choose heart-healthy fats.  Limit cheese to 1 oz (28 g) per day.  Eat more home-cooked food and less restaurant, buffet, and fast food.  Limit fried foods.  Cook foods using methods other than frying.  Limit canned vegetables. If you do use them, rinse them well to decrease the sodium.  When eating at a restaurant, ask that your food be prepared with less salt, or no salt if possible. WHAT FOODS CAN I EAT? Seek help from a dietitian for individual calorie needs. Grains Whole grain or whole wheat bread. Brown rice. Whole grain or whole wheat pasta. Quinoa, bulgur, and whole grain cereals. Low-sodium cereals. Corn or whole wheat flour tortillas. Whole grain cornbread. Whole grain crackers. Low-sodium crackers. Vegetables Fresh or frozen vegetables (raw, steamed, roasted, or grilled). Low-sodium or reduced-sodium tomato and vegetable juices. Low-sodium or reduced-sodium tomato sauce and paste. Low-sodium or reduced-sodium canned vegetables.  Fruits All fresh, canned (in natural juice), or frozen fruits. Meat and Other Protein Products Ground beef (85% or leaner), grass-fed beef, or beef trimmed of fat. Skinless chicken or [place]. Ground chicken or [place]. Pork trimmed of fat. All fish and seafood. Eggs. Dried beans, peas, or lentils. Unsalted nuts and seeds. Unsalted canned beans. Dairy Low-fat dairy products, such as skim or 1% milk, 2% or reduced-fat cheeses, low-fat ricotta or cottage cheese, or plain low-fat yogurt. Low-sodium or reduced-sodium cheeses. Fats and Oils Tub margarines without trans  fats. Light or reduced-fat mayonnaise and salad dressings (reduced sodium). Avocado. Safflower, olive, or canola oils. Natural peanut or almond butter. Other Unsalted popcorn and pretzels. The items listed above may not be a complete list of recommended foods or beverages. Contact your dietitian for more options. WHAT FOODS ARE NOT RECOMMENDED? Grains White  bread. White pasta. White rice. Refined cornbread. Bagels and croissants. Crackers that contain trans fat. Vegetables Creamed or fried vegetables. Vegetables in a cheese sauce. Regular canned vegetables. Regular canned tomato sauce and paste. Regular tomato and vegetable juices. Fruits Dried fruits. Canned fruit in light or heavy syrup. Fruit juice. Meat and Other Protein Products Fatty cuts of meat. Ribs, chicken wings, bacon, sausage, bologna, salami, chitterlings, fatback, hot dogs, bratwurst, and packaged luncheon meats. Salted nuts and seeds. Canned beans with salt. Dairy Whole or 2% milk, cream, half-and-half, and cream cheese. Whole-fat or sweetened yogurt. Full-fat cheeses or blue cheese. Nondairy creamers and whipped toppings. Processed cheese, cheese spreads, or cheese curds. Condiments Onion and garlic salt, seasoned salt, table salt, and sea salt. Canned and packaged gravies. Worcestershire sauce. Tartar sauce. Barbecue sauce. Teriyaki sauce. Soy sauce, including reduced sodium. Steak sauce. Fish sauce. Oyster sauce. Cocktail sauce. Horseradish. Ketchup and mustard. Meat flavorings and tenderizers. Bouillon cubes. Hot sauce. Tabasco sauce. Marinades. Taco seasonings. Relishes. Fats and Oils Butter, stick margarine, lard, shortening, ghee, and bacon fat. Coconut, palm kernel, or palm oils. Regular salad dressings. Other Pickles and olives. Salted popcorn and pretzels. The items listed above may not be a complete list of foods and beverages to avoid. Contact your dietitian for more information. WHERE CAN I FIND MORE INFORMATION? National Heart, Lung, and Blood Institute: travelstabloid.com   This information is not intended to replace advice given to you by your health care provider. Make sure you discuss any questions you have with your health care provider.   Document Released: 03/29/2011 Document Revised: 04/30/2014 Document Reviewed:  02/11/2013 Elsevier Interactive Patient Education [employer].

## 2015-02-11 NOTE — Progress Notes (Signed)
[facility] [address], [place] [address] [phone]  Date:  02/11/2015   Name:  Randy [name]   DOB:  1960/07/27   MRN:  [id number]  PCP:  No primary care provider on file.    History of Present Illness:  Randy [name] is a 54 y.o. male patient who presents to [facility] for assessment of elevated blood pressure and cholesterol recheck.  Patient states that he was dxd with hypertension 3 years ago, placed on medication though does not recall the anti-hypertensive.  Wife bought a blood pressure monitor at home and was found to have bp 160-170/90s.   Patient denies any chest pain, palpitations, leg swelling, sob, or dyspnea.  He has no dizziness or vision changes. He is not exercising at this time.   His diet includes fast food with hamburgers, and honeybuns from the snack machines.    He would also like a prostate exam at this time.  He has not been checked for years.  He has no hx of dysuria, dribbling or weakened stream, hematuria, or constipation.     There are no active problems to display for this patient.   Past Medical History  Diagnosis Date  . Hyperlipidemia     Past Surgical History  Procedure Laterality Date  . Appendectomy      Social History  Substance Use Topics  . Smoking status: Never Smoker   . Smokeless tobacco: None  . Alcohol Use: No    Family History  Problem Relation Age of Onset  . Diabetes Mother   . Hypertension Father   . Kidney disease Father     No Known Allergies  Medication list has been reviewed and updated.  No current outpatient prescriptions on file prior to visit.   Current Facility-Administered Medications on File Prior to Visit  Medication Dose Route Frequency Provider Last Rate Last Dose  . ondansetron (ZOFRAN) 4 mg in sodium chloride 0.9 % 50 mL IVPB  4 mg Intravenous Once Thao P Le, DO        ROS ROS otherwise unremarkable unless listed above.     Physical Examination: BP 124/80 mmHg   Pulse 85  Temp(Src) 98.5 F (36.9 C) (Oral)  Resp 17  Ht 6' 1.5" (1.867 m)  Wt 191 lb (86.637 kg)  BMI 24.86 kg/m2  SpO2 99% Ideal Body Weight: Weight in (lb) to have BMI = 25: 191.7  Physical Exam  Constitutional: He is oriented to person, place, and time. He appears well-developed and well-nourished. No distress.  HENT:  Head: Normocephalic and atraumatic.  Eyes: Conjunctivae and EOM are normal. Pupils are equal, round, and reactive to light.  Cardiovascular: Normal rate and regular rhythm.  Exam reveals no gallop and no friction rub.   No murmur heard. Pulses:      Radial pulses are 2+ on the right side, and 2+ on the left side.       Dorsalis pedis pulses are 2+ on the right side, and 2+ on the left side.  Pulmonary/Chest: Effort normal. No respiratory distress.  Genitourinary: Prostate normal.  Neurological: He is alert and oriented to person, place, and time.  Skin: Skin is warm and dry. He is not diaphoretic.  Psychiatric: He has a normal mood and affect. His behavior is normal.     Assessment and Plan: Randy [name] is a 54 y.o. male who is here today for elevated bp and cholesterol recheck.   -advised dash diet, and  restrictions from canned goods and snack choices -risks given to patient of the amlodipine -advised exercise 4 times per week for 30 minutes -hydrate increase. -rtc in 14 days for follow up.  Advised that he schedule at primary clinic for annual physical exam--will include psa if so.   Hyperlipidemia - Plan: Lipid panel  Essential hypertension - Plan: COMPLETE METABOLIC PANEL WITH GFR, Lipid panel, amLODipine (NORVASC) 5 MG tablet  Ivar Drape, PA-C [facility] 02/11/2015 9:31 AM

## 2015-06-14 ENCOUNTER — Ambulatory Visit (INDEPENDENT_AMBULATORY_CARE_PROVIDER_SITE_OTHER): Payer: BLUE CROSS/BLUE SHIELD | Admitting: Family Medicine

## 2015-06-14 VITALS — BP 140/96 | HR 94 | Temp 98.9°F | Resp 18 | Ht 75.25 in | Wt 204.0 lb

## 2015-06-14 DIAGNOSIS — J101 Influenza due to other identified influenza virus with other respiratory manifestations: Secondary | ICD-10-CM

## 2015-06-14 DIAGNOSIS — I10 Essential (primary) hypertension: Secondary | ICD-10-CM | POA: Diagnosis not present

## 2015-06-14 DIAGNOSIS — R6889 Other general symptoms and signs: Secondary | ICD-10-CM

## 2015-06-14 LAB — POCT INFLUENZA A/B
Influenza A, POC: POSITIVE — AB
Influenza B, POC: NEGATIVE

## 2015-06-14 MED ORDER — AMLODIPINE BESYLATE 10 MG PO TABS: 10.0000 mg | ORAL_TABLET | Freq: Every day | ORAL | Status: DC

## 2015-06-14 MED ORDER — BENZONATATE 100 MG PO CAPS: 100.0000 mg | ORAL_CAPSULE | Freq: Three times a day (TID) | ORAL | Status: DC | PRN

## 2015-06-14 MED ORDER — HYDROCODONE-HOMATROPINE 5-1.5 MG/5ML PO SYRP: 5.0000 mL | ORAL_SOLUTION | ORAL | Status: DC | PRN

## 2015-06-14 NOTE — Progress Notes (Signed)
Patient ID: Randy [name], male    DOB: 1960/12/19  Age: 55 y.o. MRN: [id number]  Chief Complaint  Patient presents with  . Cough    X Saturday  . Chest Congestion    X Saturday  . Chills    X Saturday    Subjective:   Patient is been sick for 3 or 4 days terrible cough some headache congestion some chills Saturday. He has not had any nausea or vomiting. He did not have a flu shot this year.  His blood pressure is high. He has been taking his medicine regularly. He wondered whether the Robitussin he took might of had some hand and raise his blood pressure.  Current allergies, medications, problem list, past/family and social histories reviewed.  Objective:  BP 140/96 mmHg  Pulse 94  Temp(Src) 98.9 F (37.2 C) (Oral)  Resp 18  Ht 6' 3.25" (1.911 m)  Wt 204 lb (92.534 kg)  BMI 25.34 kg/m2  SpO2 98%  Looks ill. Eyes injected. TMs normal. Throat clear. Nose congested. Neck supple without nodes. Chest clear. Heart regular without murmur. Himself without mass or tenderness.  Assessment & Plan:   Assessment: 1. Flu-like symptoms   2. Essential hypertension   3. Influenza A       Plan: Swab  Orders Placed This Encounter  Procedures  . POCT Influenza A/B   Results for orders placed or performed in visit on 06/14/15  POCT Influenza A/B  Result Value Ref Range   Influenza A, POC Positive (A) Negative   Influenza B, POC Negative Negative    Meds ordered this encounter  Medications  . amLODipine (NORVASC) 10 MG tablet    Sig: Take 1 tablet (10 mg total) by mouth daily.    Dispense:  90 tablet    Refill:  3  . HYDROcodone-homatropine (HYCODAN) 5-1.5 MG/5ML syrup    Sig: Take 5 mLs by mouth every 4 (four) hours as needed.    Dispense:  120 mL    Refill:  0  . benzonatate (TESSALON) 100 MG capsule    Sig: Take 1-2 capsules (100-200 mg total) by mouth 3 (three) times daily as needed.    Dispense:  30 capsule    Refill:  0         Patient Instructions    Drink plenty of fluids and get enough rest  Increase amlodipine to 10 mg daily  Return in about 6 months for a follow-up blood pressure check  Take the Hycodan cough syrup 1 teaspoon every 4-6 hours as needed for cough  When you return to work, or in the daytime, you can use the benzonatate cough pills one or 2 pills 3 times daily as needed for cough. This doesn't work as well but will not make you drowsy.  You can take an over-the-counter antihistamine decongestant such as Claritin-D or Allegra D or Zyrtec-D if needed for head congestion.  Return if worse at any time.   Influenza, Adult Influenza ("the flu") is a viral infection of the respiratory tract. It occurs more often in winter months because people spend more time in close contact with one another. Influenza can make you feel very sick. Influenza easily spreads from person to person (contagious). CAUSES  Influenza is caused by a virus that infects the respiratory tract. You can catch the virus by breathing in droplets from an infected person's cough or sneeze. You can also catch the virus by touching something that was recently contaminated with the virus  and then touching your mouth, nose, or eyes. RISKS AND COMPLICATIONS You may be at risk for a more severe case of influenza if you smoke cigarettes, have diabetes, have chronic heart disease (such as heart failure) or lung disease (such as asthma), or if you have a weakened immune system. Elderly people and pregnant women are also at risk for more serious infections. The most common problem of influenza is a lung infection (pneumonia). Sometimes, this problem can require emergency medical care and may be life threatening. SIGNS AND SYMPTOMS  Symptoms typically last 4 to 10 days and may include:  Fever.  Chills.  Headache, body aches, and muscle aches.  Sore throat.  Chest discomfort and cough.  Poor appetite.  Weakness or feeling tired.  Dizziness.  Nausea or  vomiting. DIAGNOSIS  Diagnosis of influenza is often made based on your history and a physical exam. A nose or throat swab test can be done to confirm the diagnosis. TREATMENT  In mild cases, influenza goes away on its own. Treatment is directed at relieving symptoms. For more severe cases, your health care provider may prescribe antiviral medicines to shorten the sickness. Antibiotic medicines are not effective because the infection is caused by a virus, not by bacteria. HOME CARE INSTRUCTIONS  Take medicines only as directed by your health care provider.  Use a cool mist humidifier to make breathing easier.  Get plenty of rest until your temperature returns to normal. This usually takes 3 to 4 days.  Drink enough fluid to keep your urine clear or pale yellow.  Cover yourmouth and nosewhen coughing or sneezing,and wash your handswellto prevent thevirusfrom spreading.  Stay homefromwork orschool untilthe fever is gonefor at least 70full day. PREVENTION  An annual influenza vaccination (flu shot) is the best way to avoid getting influenza. An annual flu shot is now routinely recommended for all adults in the [place] IF:  You experiencechest pain, yourcough worsens,or you producemore mucus.  Youhave nausea,vomiting, ordiarrhea.  Your fever returns or gets worse. SEEK IMMEDIATE MEDICAL CARE IF:  You havetrouble breathing, you become short of breath,or your skin ornails becomebluish.  You have severe painor stiffnessin the neck.  You develop a sudden headache, or pain in the face or ear.  You have nausea or vomiting that you cannot control. MAKE SURE YOU:   Understand these instructions.  Will watch your condition.  Will get help right away if you are not doing well or get worse.   This information is not intended to replace advice given to you by your health care provider. Make sure you discuss any questions you have with your health  care provider.   Document Released: 04/06/2000 Document Revised: 04/30/2014 Document Reviewed: 07/09/2011 Elsevier Interactive Patient Education [employer].        Return in about 6 months (around 12/12/2015).   Gerhardt Gleed, MD 06/14/2015

## 2015-06-14 NOTE — Patient Instructions (Signed)
Drink plenty of fluids and get enough rest  Increase amlodipine to 10 mg daily  Return in about 6 months for a follow-up blood pressure check  Take the Hycodan cough syrup 1 teaspoon every 4-6 hours as needed for cough  When you return to work, or in the daytime, you can use the benzonatate cough pills one or 2 pills 3 times daily as needed for cough. This doesn't work as well but will not make you drowsy.  You can take an over-the-counter antihistamine decongestant such as Claritin-D or Allegra D or Zyrtec-D if needed for head congestion.  Return if worse at any time.   Influenza, Adult Influenza ("the flu") is a viral infection of the respiratory tract. It occurs more often in winter months because people spend more time in close contact with one another. Influenza can make you feel very sick. Influenza easily spreads from person to person (contagious). CAUSES  Influenza is caused by a virus that infects the respiratory tract. You can catch the virus by breathing in droplets from an infected person's cough or sneeze. You can also catch the virus by touching something that was recently contaminated with the virus and then touching your mouth, nose, or eyes. RISKS AND COMPLICATIONS You may be at risk for a more severe case of influenza if you smoke cigarettes, have diabetes, have chronic heart disease (such as heart failure) or lung disease (such as asthma), or if you have a weakened immune system. Elderly people and pregnant women are also at risk for more serious infections. The most common problem of influenza is a lung infection (pneumonia). Sometimes, this problem can require emergency medical care and may be life threatening. SIGNS AND SYMPTOMS  Symptoms typically last 4 to 10 days and may include:  Fever.  Chills.  Headache, body aches, and muscle aches.  Sore throat.  Chest discomfort and cough.  Poor appetite.  Weakness or feeling tired.  Dizziness.  Nausea or  vomiting. DIAGNOSIS  Diagnosis of influenza is often made based on your history and a physical exam. A nose or throat swab test can be done to confirm the diagnosis. TREATMENT  In mild cases, influenza goes away on its own. Treatment is directed at relieving symptoms. For more severe cases, your health care provider may prescribe antiviral medicines to shorten the sickness. Antibiotic medicines are not effective because the infection is caused by a virus, not by bacteria. HOME CARE INSTRUCTIONS  Take medicines only as directed by your health care provider.  Use a cool mist humidifier to make breathing easier.  Get plenty of rest until your temperature returns to normal. This usually takes 3 to 4 days.  Drink enough fluid to keep your urine clear or pale yellow.  Cover yourmouth and nosewhen coughing or sneezing,and wash your handswellto prevent thevirusfrom spreading.  Stay homefromwork orschool untilthe fever is gonefor at least 59full day. PREVENTION  An annual influenza vaccination (flu shot) is the best way to avoid getting influenza. An annual flu shot is now routinely recommended for all adults in the [place] IF:  You experiencechest pain, yourcough worsens,or you producemore mucus.  Youhave nausea,vomiting, ordiarrhea.  Your fever returns or gets worse. SEEK IMMEDIATE MEDICAL CARE IF:  You havetrouble breathing, you become short of breath,or your skin ornails becomebluish.  You have severe painor stiffnessin the neck.  You develop a sudden headache, or pain in the face or ear.  You have nausea or vomiting that you cannot control. MAKE  SURE YOU:   Understand these instructions.  Will watch your condition.  Will get help right away if you are not doing well or get worse.   This information is not intended to replace advice given to you by your health care provider. Make sure you discuss any questions you have with your health  care provider.   Document Released: 04/06/2000 Document Revised: 04/30/2014 Document Reviewed: 07/09/2011 Elsevier Interactive Patient Education [employer].

## 2015-12-08 ENCOUNTER — Ambulatory Visit (INDEPENDENT_AMBULATORY_CARE_PROVIDER_SITE_OTHER): Payer: BLUE CROSS/BLUE SHIELD | Admitting: Urgent Care

## 2015-12-08 VITALS — BP 140/78 | HR 107 | Temp 98.9°F | Resp 16 | Ht 75.0 in | Wt 206.8 lb

## 2015-12-08 DIAGNOSIS — J309 Allergic rhinitis, unspecified: Secondary | ICD-10-CM

## 2015-12-08 DIAGNOSIS — H109 Unspecified conjunctivitis: Secondary | ICD-10-CM

## 2015-12-08 DIAGNOSIS — H578 Other specified disorders of eye and adnexa: Secondary | ICD-10-CM

## 2015-12-08 DIAGNOSIS — H5789 Other specified disorders of eye and adnexa: Secondary | ICD-10-CM

## 2015-12-08 MED ORDER — FLUTICASONE PROPIONATE 50 MCG/ACT NA SUSP: 2.0000 | Freq: Every day | NASAL | 11 refills | Status: DC

## 2015-12-08 MED ORDER — POLYMYXIN B-TRIMETHOPRIM 10000-0.1 UNIT/ML-% OP SOLN: 1.0000 [drp] | OPHTHALMIC | 0 refills | Status: DC

## 2015-12-08 MED ORDER — AZELASTINE HCL 0.05 % OP SOLN: 1.0000 [drp] | Freq: Two times a day (BID) | OPHTHALMIC | 12 refills | Status: DC

## 2015-12-08 MED ORDER — CETIRIZINE HCL 10 MG PO TABS: 10.0000 mg | ORAL_TABLET | Freq: Every day | ORAL | 11 refills | Status: DC

## 2015-12-08 NOTE — Patient Instructions (Addendum)
Allergic Rhinitis Allergic rhinitis is when the mucous membranes in the nose respond to allergens. Allergens are particles in the air that cause your body to have an allergic reaction. This causes you to release allergic antibodies. Through a chain of events, these eventually cause you to release histamine into the blood stream. Although meant to protect the body, it is this release of histamine that causes your discomfort, such as frequent sneezing, congestion, and an itchy, runny nose.  CAUSES Seasonal allergic rhinitis (hay fever) is caused by pollen allergens that may come from grasses, trees, and weeds. Year-round allergic rhinitis (perennial allergic rhinitis) is caused by allergens such as house dust mites, pet dander, and mold spores. SYMPTOMS  Nasal stuffiness (congestion).  Itchy, runny nose with sneezing and tearing of the eyes. DIAGNOSIS Your health care provider can help you determine the allergen or allergens that trigger your symptoms. If you and your health care provider are unable to determine the allergen, skin or blood testing may be used. Your health care provider will diagnose your condition after taking your health history and performing a physical exam. Your health care provider may assess you for other related conditions, such as asthma, pink eye, or an ear infection. TREATMENT Allergic rhinitis does not have a cure, but it can be controlled by:  Medicines that block allergy symptoms. These may include allergy shots, nasal sprays, and oral antihistamines.  Avoiding the allergen. Hay fever may often be treated with antihistamines in pill or nasal spray forms. Antihistamines block the effects of histamine. There are over-the-counter medicines that may help with nasal congestion and swelling around the eyes. Check with your health care provider before taking or giving this medicine. If avoiding the allergen or the medicine prescribed do not work, there are many new medicines  your health care provider can prescribe. Stronger medicine may be used if initial measures are ineffective. Desensitizing injections can be used if medicine and avoidance does not work. Desensitization is when a patient is given ongoing shots until the body becomes less sensitive to the allergen. Make sure you follow up with your health care provider if problems continue. HOME CARE INSTRUCTIONS It is not possible to completely avoid allergens, but you can reduce your symptoms by taking steps to limit your exposure to them. It helps to know exactly what you are allergic to so that you can avoid your specific triggers. SEEK MEDICAL CARE IF:  You have a fever.  You develop a cough that does not stop easily (persistent).  You have shortness of breath.  You start wheezing.  Symptoms interfere with normal daily activities.   This information is not intended to replace advice given to you by your health care provider. Make sure you discuss any questions you have with your health care provider.   Document Released: 01/02/2001 Document Revised: 04/30/2014 Document Reviewed: 12/15/2012 Elsevier Interactive Patient Education 2016 [employer].     IF you received an x-ray today, you will receive an invoice from [facility]. Please contact [facility] at [phone] with questions or concerns regarding your invoice.   IF you received labwork today, you will receive an invoice from [employer]. Please contact [facility] at [phone] with questions or concerns regarding your invoice.   Our billing staff will not be able to assist you with questions regarding bills from these companies.  You will be contacted with the lab results as soon as they are available. The fastest way to get your results is to  activate your My Chart account. Instructions are located on the last page of this paperwork. If you have not heard from [place] regarding the results in 2 weeks,  please contact this office.
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
## 2016-09-17 ENCOUNTER — Other Ambulatory Visit: Payer: Self-pay | Admitting: Family Medicine

## 2016-09-17 DIAGNOSIS — I10 Essential (primary) hypertension: Secondary | ICD-10-CM

## 2016-09-17 NOTE — Telephone Encounter (Signed)
Call --- I only approved a 30 day supply of Amlodipine as patient is overdue for follow-up of high blood pressure.  Dr. Linna Darner has retired.  Please schedule OV this month with NEW provider to establish care and for follow-up of high blood pressure.

## 2016-12-17 ENCOUNTER — Other Ambulatory Visit: Payer: Self-pay | Admitting: Family Medicine

## 2016-12-17 DIAGNOSIS — I10 Essential (primary) hypertension: Secondary | ICD-10-CM

## 2016-12-23 ENCOUNTER — Other Ambulatory Visit: Payer: Self-pay | Admitting: Family Medicine

## 2016-12-23 DIAGNOSIS — I10 Essential (primary) hypertension: Secondary | ICD-10-CM

## 2017-06-27 ENCOUNTER — Ambulatory Visit (INDEPENDENT_AMBULATORY_CARE_PROVIDER_SITE_OTHER): Payer: BLUE CROSS/BLUE SHIELD | Admitting: Urgent Care

## 2017-06-27 ENCOUNTER — Encounter: Payer: Self-pay | Admitting: Urgent Care

## 2017-06-27 ENCOUNTER — Other Ambulatory Visit: Payer: Self-pay

## 2017-06-27 VITALS — BP 158/109 | HR 106 | Temp 98.9°F | Resp 18 | Ht 75.0 in | Wt 204.2 lb

## 2017-06-27 DIAGNOSIS — Z1159 Encounter for screening for other viral diseases: Secondary | ICD-10-CM | POA: Diagnosis not present

## 2017-06-27 DIAGNOSIS — Z114 Encounter for screening for human immunodeficiency virus [HIV]: Secondary | ICD-10-CM | POA: Diagnosis not present

## 2017-06-27 DIAGNOSIS — Z0000 Encounter for general adult medical examination without abnormal findings: Secondary | ICD-10-CM | POA: Diagnosis not present

## 2017-06-27 DIAGNOSIS — Z23 Encounter for immunization: Secondary | ICD-10-CM

## 2017-06-27 DIAGNOSIS — M542 Cervicalgia: Secondary | ICD-10-CM | POA: Diagnosis not present

## 2017-06-27 DIAGNOSIS — Z1211 Encounter for screening for malignant neoplasm of colon: Secondary | ICD-10-CM

## 2017-06-27 DIAGNOSIS — Z8679 Personal history of other diseases of the circulatory system: Secondary | ICD-10-CM

## 2017-06-27 DIAGNOSIS — R011 Cardiac murmur, unspecified: Secondary | ICD-10-CM

## 2017-06-27 DIAGNOSIS — Z13220 Encounter for screening for lipoid disorders: Secondary | ICD-10-CM

## 2017-06-27 DIAGNOSIS — Z130 Encounter for screening for diseases of the blood and blood-forming organs and certain disorders involving the immune mechanism: Secondary | ICD-10-CM | POA: Diagnosis not present

## 2017-06-27 DIAGNOSIS — Z13228 Encounter for screening for other metabolic disorders: Secondary | ICD-10-CM

## 2017-06-27 DIAGNOSIS — Z1329 Encounter for screening for other suspected endocrine disorder: Secondary | ICD-10-CM | POA: Diagnosis not present

## 2017-06-27 DIAGNOSIS — M549 Dorsalgia, unspecified: Secondary | ICD-10-CM

## 2017-06-27 NOTE — Patient Instructions (Addendum)
You may take 500mg  Tylenol every 6 hours for pain and inflammation. Use Flexeril at night for muscle spasms of your trapezius. Hydrate well with at least 2 liters (1 gallon) of water daily. If you are still having pain in 1-2 weeks, please come on back so that we can check for arthritis using an x-ray.    Health Maintenance, Male A healthy lifestyle and preventive care is important for your health and wellness. Ask your health care provider about what schedule of regular examinations is right for you. What should I know about weight and diet? Eat a Healthy Diet  Eat plenty of vegetables, fruits, whole grains, low-fat dairy products, and lean protein.  Do not eat a lot of foods high in solid fats, added sugars, or salt.  Maintain a Healthy Weight Regular exercise can help you achieve or maintain a healthy weight. You should:  Do at least 150 minutes of exercise each week. The exercise should increase your heart rate and make you sweat (moderate-intensity exercise).  Do strength-training exercises at least twice a week.  Watch Your Levels of Cholesterol and Blood Lipids  Have your blood tested for lipids and cholesterol every 5 years starting at 58 years of age. If you are at high risk for heart disease, you should start having your blood tested when you are 58 years old. You may need to have your cholesterol levels checked more often if: ? Your lipid or cholesterol levels are high. ? You are older than 58 years of age. ? You are at high risk for heart disease.  What should I know about cancer screening? Many types of cancers can be detected early and may often be prevented. Lung Cancer  You should be screened every year for lung cancer if: ? You are a current smoker who has smoked for at least 30 years. ? You are a former smoker who has quit within the past 15 years.  Talk to your health care provider about your screening options, when you should start screening, and how often you  should be screened.  Colorectal Cancer  Routine colorectal cancer screening usually begins at 58 years of age and should be repeated every 5-10 years until you are 58 years old. You may need to be screened more often if early forms of precancerous polyps or small growths are found. Your health care provider may recommend screening at an earlier age if you have risk factors for colon cancer.  Your health care provider may recommend using home test kits to check for hidden blood in the stool.  A small camera at the end of a tube can be used to examine your colon (sigmoidoscopy or colonoscopy). This checks for the earliest forms of colorectal cancer.  Prostate and Testicular Cancer  Depending on your age and overall health, your health care provider may do certain tests to screen for prostate and testicular cancer.  Talk to your health care provider about any symptoms or concerns you have about testicular or prostate cancer.  Skin Cancer  Check your skin from head to toe regularly.  Tell your health care provider about any new moles or changes in moles, especially if: ? There is a change in a mole's size, shape, or color. ? You have a mole that is larger than a pencil eraser.  Always use sunscreen. Apply sunscreen liberally and repeat throughout the day.  Protect yourself by wearing long sleeves, pants, a wide-brimmed hat, and sunglasses when outside.  What should I  know about heart disease, diabetes, and high blood pressure?  If you are 61-83 years of age, have your blood pressure checked every 3-5 years. If you are 18 years of age or older, have your blood pressure checked every year. You should have your blood pressure measured twice-once when you are at a hospital or clinic, and once when you are not at a hospital or clinic. Record the average of the two measurements. To check your blood pressure when you are not at a hospital or clinic, you can use: ? An automated blood pressure  machine at a pharmacy. ? A home blood pressure monitor.  Talk to your health care provider about your target blood pressure.  If you are between 34-70 years old, ask your health care provider if you should take aspirin to prevent heart disease.  Have regular diabetes screenings by checking your fasting blood sugar level. ? If you are at a normal weight and have a low risk for diabetes, have this test once every three years after the age of 44. ? If you are overweight and have a high risk for diabetes, consider being tested at a younger age or more often.  A one-time screening for abdominal aortic aneurysm (AAA) by ultrasound is recommended for men aged 11-75 years who are current or former smokers. What should I know about preventing infection? Hepatitis B If you have a higher risk for hepatitis B, you should be screened for this virus. Talk with your health care provider to find out if you are at risk for hepatitis B infection. Hepatitis C Blood testing is recommended for:  Everyone born from [birth year] through 1966.  Anyone with known risk factors for hepatitis C.  Sexually Transmitted Diseases (STDs)  You should be screened each year for STDs including gonorrhea and chlamydia if: ? You are sexually active and are younger than 58 years of age. ? You are older than 58 years of age and your health care provider tells you that you are at risk for this type of infection. ? Your sexual activity has changed since you were last screened and you are at an increased risk for chlamydia or gonorrhea. Ask your health care provider if you are at risk.  Talk with your health care provider about whether you are at high risk of being infected with HIV. Your health care provider may recommend a prescription medicine to help prevent HIV infection.  What else can I do?  Schedule regular health, dental, and eye exams.  Stay current with your vaccines (immunizations).  Do not use any tobacco products,  such as cigarettes, chewing tobacco, and e-cigarettes. If you need help quitting, ask your health care provider.  Limit alcohol intake to no more than 2 drinks per day. One drink equals 12 ounces of beer, 5 ounces of wine, or 1 ounces of hard liquor.  Do not use street drugs.  Do not share needles.  Ask your health care provider for help if you need support or information about quitting drugs.  Tell your health care provider if you often feel depressed.  Tell your health care provider if you have ever been abused or do not feel safe at home. This information is not intended to replace advice given to you by your health care provider. Make sure you discuss any questions you have with your health care provider. Document Released: 10/06/2007 Document Revised: 12/07/2015 Document Reviewed: 01/11/2015 Elsevier Interactive Patient Education  2018 [employer].     IF  you received an x-ray today, you will receive an invoice from [facility]. Please contact [facility] at [phone] with questions or concerns regarding your invoice.   IF you received labwork today, you will receive an invoice from [facility]. Please contact [facility] at [phone] with questions or concerns regarding your invoice.   Our billing staff will not be able to assist you with questions regarding bills from these companies.  You will be contacted with the lab results as soon as they are available. The fastest way to get your results is to activate your My Chart account. Instructions are located on the last page of this paperwork. If you have not heard from [place] regarding the results in 2 weeks, please contact this office.

## 2017-06-27 NOTE — Progress Notes (Signed)
MRN: [id number]  Subjective:   [name] is a 57 y.o. male presenting for annual physical exam. Patient is married, works in [employer] development [employer]. Has good relationships at home, has a good support network. Has to use his arms, back a lot for work. Reports that he has had intermittent pain of his left neck, upper back and shoulder. Has been using Biofreeze with some relief. Has not tried oral medications. Denies falls, trauma, weakness, numbness or tingling. Denies smoking cigarettes or drinking alcohol.   Medical care team includes: PCP: Patient, No Pcp Per Vision: No visual deficits. Specialists: Dr. Wynonia Lawman for heart murmur and history of rheumatic fever. Patient would like [place] to schedule a consult for a recheck.  Health Maintenance: Needs to have colonoscopy scheduled.   [name] has a current medication list which includes the following prescription(s): amlodipine, azelastine, cetirizine, fluticasone, and trimethoprim-polymyxin b. He has No Known Allergies.  Mylik  has a past medical history of Hyperlipidemia. Also  has a past surgical history that includes Appendectomy. His family history includes Diabetes in his mother; Hypertension in his father; Kidney disease in his father.  Immunizations: Will update flu and tdap today.  Review of Systems  Constitutional: Negative for chills, diaphoresis, fever, malaise/fatigue and weight loss.  HENT: Negative for congestion, ear discharge, ear pain, hearing loss, nosebleeds, sore throat and tinnitus.   Eyes: Negative for blurred vision, double vision, photophobia, pain, discharge and redness.  Respiratory: Negative for cough, shortness of breath and wheezing.   Cardiovascular: Negative for chest pain, palpitations and leg swelling.  Gastrointestinal: Negative for abdominal pain, blood in stool, constipation, diarrhea, nausea and vomiting.  Genitourinary: Negative for dysuria, flank pain, frequency, hematuria and urgency.  Musculoskeletal:  Positive for back pain (left upper back), joint pain (left posterior shoulder) and neck pain (left lower neck). Negative for myalgias.  Skin: Negative for itching and rash.  Neurological: Negative for dizziness, tingling, seizures, loss of consciousness, weakness and headaches.  Endo/Heme/Allergies: Negative for polydipsia.  Psychiatric/Behavioral: Negative for depression, hallucinations, memory loss, substance abuse and suicidal ideas. The patient is not nervous/anxious and does not have insomnia.    Objective:   Vitals: BP (!) 158/109 Comment: has not been taking his bp medication  Pulse (!) 106   Temp 98.9 F (37.2 C) (Oral)   Resp 18   Ht 6\' 3"  (1.905 m)   Wt 204 lb 3.2 oz (92.6 kg)   SpO2 96%   BMI 25.52 kg/m   BP Readings from Last 3 Encounters:  06/27/17 (!) 158/109  12/08/15 140/78  06/14/15 (!) 140/96     Visual Acuity Screening   Right eye Left eye Both eyes  Without correction: 20/25 20/25 20/25   With correction:       Physical Exam  Constitutional: He is oriented to person, place, and time. He appears well-developed and well-nourished.  HENT:  TM's intact bilaterally, no effusions or erythema. Nasal turbinates pink and moist, nasal passages patent. No sinus tenderness. Oropharynx clear, mucous membranes moist, dentition in good repair.  Eyes: Conjunctivae and EOM are normal. Pupils are equal, round, and reactive to light. Right eye exhibits no discharge. Left eye exhibits no discharge. No scleral icterus.  Neck: Normal range of motion. Neck supple. No thyromegaly present.  Cardiovascular: Normal rate, regular rhythm and intact distal pulses. Exam reveals no gallop and no friction rub.  No murmur heard. Pulmonary/Chest: No stridor. No respiratory distress. He has no wheezes. He has no rales.  Abdominal: Soft. Bowel sounds are  normal. He exhibits no distension and no mass. There is no tenderness.  Genitourinary: Prostate is not enlarged and not tender.    Musculoskeletal: Normal range of motion. He exhibits no edema or tenderness.  Lymphadenopathy:    He has no cervical adenopathy.  Neurological: He is alert and oriented to person, place, and time. He has normal reflexes. He displays normal reflexes. Coordination normal.  Skin: Skin is warm and dry. No rash noted. No erythema. No pallor.  Psychiatric: He has a normal mood and affect.   Assessment and Plan :   Annual physical exam  Screening for metabolic disorder - Plan: Comprehensive metabolic panel  Screening cholesterol level - Plan: Lipid panel  Screening for thyroid disorder - Plan: TSH  Screening for deficiency anemia - Plan: CBC  Screening for HIV without presence of risk factors - Plan: HIV antibody  Encounter for hepatitis C screening test for low risk patient - Plan: Hepatitis C antibody  Need for prophylactic vaccination and inoculation against influenza - Plan: Flu Vaccine QUAD 6+ mos PF IM (Fluarix Quad PF)  Screen for colon cancer - Plan: Ambulatory referral to Gastroenterology  History of rheumatic fever - Plan: Ambulatory referral to Cardiology  Heart murmur - Plan: Ambulatory referral to Cardiology  Neck pain  Upper back pain  Patient is very pleasant, medically stable. Labs pending. Discussed healthy lifestyle, diet, exercise, preventative care, vaccinations, and addressed patient's concerns. Referred to GI for screening colonoscopy. Referred back to Dr. Wynonia Lawman for a recheck of his heart murmur although none was auscultated today, patient would like to make sure he is doing okay. Offered patient Flexeril for his back. He will use APAP if he needs to. Patient prefers to see how his neck, back and left shoulder pain progresses and will set up an office visit as needed. Will follow up with blood pressure recommendations by phone once labs result.  Jaynee Eagles, PA-C [facility] [phone] 06/27/2017  9:46 AM

## 2017-06-28 LAB — COMPREHENSIVE METABOLIC PANEL
ALT: 18 IU/L (ref 0–44)
AST: 21 IU/L (ref 0–40)
Albumin/Globulin Ratio: 1.2 (ref 1.2–2.2)
Albumin: 4.4 g/dL (ref 3.5–5.5)
Alkaline Phosphatase: 112 IU/L (ref 39–117)
BUN/Creatinine Ratio: 12 (ref 9–20)
BUN: 13 mg/dL (ref 6–24)
Bilirubin Total: 0.6 mg/dL (ref 0.0–1.2)
CO2: 22 mmol/L (ref 20–29)
Calcium: 10.1 mg/dL (ref 8.7–10.2)
Chloride: 100 mmol/L (ref 96–106)
Creatinine, Ser: 1.09 mg/dL (ref 0.76–1.27)
GFR calc Af Amer: 87 mL/min/{1.73_m2} (ref >59)
GFR calc non Af Amer: 75 mL/min/{1.73_m2} (ref >59)
Globulin, Total: 3.7 g/dL (ref 1.5–4.5)
Glucose: 111 mg/dL — ABNORMAL HIGH (ref 65–99)
Potassium: 4.3 mmol/L (ref 3.5–5.2)
Sodium: 137 mmol/L (ref 134–144)
Total Protein: 8.1 g/dL (ref 6.0–8.5)

## 2017-06-28 LAB — TSH: TSH: 0.457 u[IU]/mL (ref 0.450–4.500)

## 2017-06-28 LAB — CBC
Hematocrit: 43.7 % (ref 37.5–51.0)
Hemoglobin: 15.1 g/dL (ref 13.0–17.7)
MCH: 30.3 pg (ref 26.6–33.0)
MCHC: 34.6 g/dL (ref 31.5–35.7)
MCV: 88 fL (ref 79–97)
Platelets: 317 10*3/uL (ref 150–379)
RBC: 4.98 x10E6/uL (ref 4.14–5.80)
RDW: 14.4 % (ref 12.3–15.4)
WBC: 4.4 10*3/uL (ref 3.4–10.8)

## 2017-06-28 LAB — LIPID PANEL
Chol/HDL Ratio: 6.2 ratio — ABNORMAL HIGH (ref 0.0–5.0)
Cholesterol, Total: 273 mg/dL — ABNORMAL HIGH (ref 100–199)
HDL: 44 mg/dL (ref >39)
LDL Calculated: 212 mg/dL — ABNORMAL HIGH (ref 0–99)
Triglycerides: 87 mg/dL (ref 0–149)
VLDL Cholesterol Cal: 17 mg/dL (ref 5–40)

## 2017-06-28 LAB — HIV ANTIBODY (ROUTINE TESTING W REFLEX): HIV Screen 4th Generation wRfx: NONREACTIVE

## 2017-06-28 LAB — HEPATITIS C ANTIBODY: Hep C Virus Ab: <0.1 s/co ratio (ref 0.0–0.9)

## 2017-07-13 ENCOUNTER — Ambulatory Visit: Payer: BLUE CROSS/BLUE SHIELD | Admitting: Urgent Care

## 2017-07-13 ENCOUNTER — Encounter: Payer: Self-pay | Admitting: Urgent Care

## 2017-07-13 VITALS — BP 154/100 | HR 79 | Temp 98.8°F | Resp 16 | Ht 75.0 in | Wt 204.8 lb

## 2017-07-13 DIAGNOSIS — E782 Mixed hyperlipidemia: Secondary | ICD-10-CM | POA: Diagnosis not present

## 2017-07-13 DIAGNOSIS — I10 Essential (primary) hypertension: Secondary | ICD-10-CM

## 2017-07-13 MED ORDER — ATORVASTATIN CALCIUM 20 MG PO TABS: 20.0000 mg | ORAL_TABLET | Freq: Every day | ORAL | 1 refills | Status: DC

## 2017-07-13 MED ORDER — AMLODIPINE BESYLATE 5 MG PO TABS: 5.0000 mg | ORAL_TABLET | Freq: Every day | ORAL | 1 refills | Status: DC

## 2017-07-13 NOTE — Progress Notes (Signed)
    MRN: [id number] DOB: 1961-04-06  Subjective:   Randy [name] is a 58 y.o. male presenting for follow up on HTN, HL. Last OV was 06/27/2017, was found to have elevated BP, HL. He comes in today for a recheck on his blood pressure and is still elevated. Patient is anxious about this and starting medications for HTN, HL. Denies dizziness, chronic headache, blurred vision, chest pain, shortness of breath, heart racing, palpitations, nausea, vomiting, abdominal pain, hematuria, lower leg swelling. Denies smoking cigarettes. Patient eats very unhealthily, eats fried foods, restaurant food.   Randy [name] is not currently taking any medications and has No Known Allergies.  Randy [name]  has a past medical history of Hyperlipidemia. Also  has a past surgical history that includes Appendectomy.  Objective:   Vitals: BP (!) 154/100   Pulse 79   Temp 98.8 F (37.1 C) (Oral)   Resp 16   Ht 6\' 3"  (1.905 m)   Wt 204 lb 12.8 oz (92.9 kg)   SpO2 97%   BMI 25.60 kg/m   The 10-year ASCVD risk score Mikey Bussing DC Jr., et al., 2013) is: 11.3%   Values used to calculate the score:     Age: 89 years     Sex: Male     Is Non-Hispanic African American: Yes     Diabetic: No     Tobacco smoker: No     Systolic Blood Pressure: 643 mmHg     Is BP treated: No     HDL Cholesterol: 44 mg/dL     Total Cholesterol: 273 mg/dL   BP Readings from Last 3 Encounters:  07/13/17 (!) 154/100  06/27/17 (!) 158/109  12/08/15 140/78    Physical Exam  Constitutional: He is oriented to person, place, and time. He appears well-developed and well-nourished.  HENT:  Mouth/Throat: Oropharynx is clear and moist.  Cardiovascular: Normal rate, regular rhythm and intact distal pulses. Exam reveals no gallop and no friction rub.  No murmur heard. Pulmonary/Chest: No respiratory distress. He has no wheezes. He has no rales.  Musculoskeletal: He exhibits no edema.  Neurological: He is alert and oriented to person, place, and time.    Skin: Skin is warm and dry.  Psychiatric: He has a normal mood and affect.   Assessment and Plan :   Essential hypertension  Mixed hyperlipidemia  Counseled patient on risks of uncontrolled HTN. He will try to make dietary modifications and start medications if his BP remains elevated. I did advise patient to just go ahead and restart his cholesterol medication. Patient would like to follow up in 4 weeks.   Jaynee Eagles, PA-C [facility] [phone] 07/13/2017 8:13 AM

## 2017-07-13 NOTE — Patient Instructions (Addendum)
Hypertension Hypertension, commonly called high blood pressure, is when the force of blood pumping through the arteries is too strong. The arteries are the blood vessels that carry blood from the heart throughout the body. Hypertension forces the heart to work harder to pump blood and may cause arteries to become narrow or stiff. Having untreated or uncontrolled hypertension can cause heart attacks, strokes, kidney disease, and other problems. A blood pressure reading consists of a higher number over a lower number. Ideally, your blood pressure should be below 120/80. The first ("top") number is called the systolic pressure. It is a measure of the pressure in your arteries as your heart beats. The second ("bottom") number is called the diastolic pressure. It is a measure of the pressure in your arteries as the heart relaxes. What are the causes? The cause of this condition is not known. What increases the risk? Some risk factors for high blood pressure are under your control. Others are not. Factors you can change  Smoking.  Having type 2 diabetes mellitus, high cholesterol, or both.  Not getting enough exercise or physical activity.  Being overweight.  Having too much fat, sugar, calories, or salt (sodium) in your diet.  Drinking too much alcohol. Factors that are difficult or impossible to change  Having chronic kidney disease.  Having a family history of high blood pressure.  Age. Risk increases with age.  Race. You may be at higher risk if you are African-American.  Gender. Men are at higher risk than women before age 45. After age 65, women are at higher risk than men.  Having obstructive sleep apnea.  Stress. What are the signs or symptoms? Extremely high blood pressure (hypertensive crisis) may cause:  Headache.  Anxiety.  Shortness of breath.  Nosebleed.  Nausea and vomiting.  Severe chest pain.  Jerky movements you cannot control (seizures).  How is this  diagnosed? This condition is diagnosed by measuring your blood pressure while you are seated, with your arm resting on a surface. The cuff of the blood pressure monitor will be placed directly against the skin of your upper arm at the level of your heart. It should be measured at least twice using the same arm. Certain conditions can cause a difference in blood pressure between your right and left arms. Certain factors can cause blood pressure readings to be lower or higher than normal (elevated) for a short period of time:  When your blood pressure is higher when you are in a health care provider's office than when you are at home, this is called white coat hypertension. Most people with this condition do not need medicines.  When your blood pressure is higher at home than when you are in a health care provider's office, this is called masked hypertension. Most people with this condition may need medicines to control blood pressure.  If you have a high blood pressure reading during one visit or you have normal blood pressure with other risk factors:  You may be asked to return on a different day to have your blood pressure checked again.  You may be asked to monitor your blood pressure at home for 1 week or longer.  If you are diagnosed with hypertension, you may have other blood or imaging tests to help your health care provider understand your overall risk for other conditions. How is this treated? This condition is treated by making healthy lifestyle changes, such as eating healthy foods, exercising more, and reducing your alcohol intake. Your   health care provider may prescribe medicine if lifestyle changes are not enough to get your blood pressure under control, and if:  Your systolic blood pressure is above 130.  Your diastolic blood pressure is above 80.  Your personal target blood pressure may vary depending on your medical conditions, your age, and other factors. Follow these  instructions at home: Eating and drinking  Eat a diet that is high in fiber and potassium, and low in sodium, added sugar, and fat. An example eating plan is called the DASH (Dietary Approaches to Stop Hypertension) diet. To eat this way: ? Eat plenty of fresh fruits and vegetables. Try to fill half of your plate at each meal with fruits and vegetables. ? Eat whole grains, such as whole wheat pasta, brown rice, or whole grain bread. Fill about one quarter of your plate with whole grains. ? Eat or drink low-fat dairy products, such as skim milk or low-fat yogurt. ? Avoid fatty cuts of meat, processed or cured meats, and poultry with skin. Fill about one quarter of your plate with lean proteins, such as fish, chicken without skin, beans, eggs, and tofu. ? Avoid premade and processed foods. These tend to be higher in sodium, added sugar, and fat.  Reduce your daily sodium intake. Most people with hypertension should eat less than 1,500 mg of sodium a day.  Limit alcohol intake to no more than 1 drink a day for nonpregnant women and 2 drinks a day for men. One drink equals 12 oz of beer, 5 oz of wine, or 1 oz of hard liquor. Lifestyle  Work with your health care provider to maintain a healthy body weight or to lose weight. Ask what an ideal weight is for you.  Get at least 30 minutes of exercise that causes your heart to beat faster (aerobic exercise) most days of the week. Activities may include walking, swimming, or biking.  Include exercise to strengthen your muscles (resistance exercise), such as pilates or lifting weights, as part of your weekly exercise routine. Try to do these types of exercises for 30 minutes at least 3 days a week.  Do not use any products that contain nicotine or tobacco, such as cigarettes and e-cigarettes. If you need help quitting, ask your health care provider.  Monitor your blood pressure at home as told by your health care provider.  Keep all follow-up visits as  told by your health care provider. This is important. Medicines  Take over-the-counter and prescription medicines only as told by your health care provider. Follow directions carefully. Blood pressure medicines must be taken as prescribed.  Do not skip doses of blood pressure medicine. Doing this puts you at risk for problems and can make the medicine less effective.  Ask your health care provider about side effects or reactions to medicines that you should watch for. Contact a health care provider if:  You think you are having a reaction to a medicine you are taking.  You have headaches that keep coming back (recurring).  You feel dizzy.  You have swelling in your ankles.  You have trouble with your vision. Get help right away if:  You develop a severe headache or confusion.  You have unusual weakness or numbness.  You feel faint.  You have severe pain in your chest or abdomen.  You vomit repeatedly.  You have trouble breathing. Summary  Hypertension is when the force of blood pumping through your arteries is too strong. If this condition is not   controlled, it may put you at risk for serious complications.  Your personal target blood pressure may vary depending on your medical conditions, your age, and other factors. For most people, a normal blood pressure is less than 120/80.  Hypertension is treated with lifestyle changes, medicines, or a combination of both. Lifestyle changes include weight loss, eating a healthy, low-sodium diet, exercising more, and limiting alcohol. This information is not intended to replace advice given to you by your health care provider. Make sure you discuss any questions you have with your health care provider. Document Released: 04/09/2005 Document Revised: 03/07/2016 Document Reviewed: 03/07/2016 Elsevier Interactive Patient Education  2018 [employer].      Cholesterol Cholesterol is a white, waxy, fat-like substance that is needed  by the human body in small amounts. The liver makes all the cholesterol we need. Cholesterol is carried from the liver by the blood through the blood vessels. Deposits of cholesterol (plaques) may build up on blood vessel (artery) walls. Plaques make the arteries narrower and stiffer. Cholesterol plaques increase the risk for heart attack and stroke. You cannot feel your cholesterol level even if it is very high. The only way to know that it is high is to have a blood test. Once you know your cholesterol levels, you should keep a record of the test results. Work with your health care provider to keep your levels in the desired range. What do the results mean?  Total cholesterol is a rough measure of all the cholesterol in your blood.  LDL (low-density lipoprotein) is the "bad" cholesterol. This is the type that causes plaque to build up on the artery walls. You want this level to be low.  HDL (high-density lipoprotein) is the "good" cholesterol because it cleans the arteries and carries the LDL away. You want this level to be high.  Triglycerides are fat that the body can either burn for energy or store. High levels are closely linked to heart disease. What are the desired levels of cholesterol?  Total cholesterol below 200.  LDL below 100 for people who are at risk, below 70 for people at very high risk.  HDL above 40 is good. A level of 60 or higher is considered to be protective against heart disease.  Triglycerides below 150. How can I lower my cholesterol? Diet Follow your diet program as told by your health care provider.  Choose fish or white meat chicken and [place], roasted or baked. Limit fatty cuts of red meat, fried foods, and processed meats, such as sausage and lunch meats.  Eat lots of fresh fruits and vegetables.  Choose whole grains, beans, pasta, potatoes, and cereals.  Choose olive oil, corn oil, or canola oil, and use only small amounts.  Avoid butter, mayonnaise,  shortening, or palm kernel oils.  Avoid foods with trans fats.  Drink skim or nonfat milk and eat low-fat or nonfat yogurt and cheeses. Avoid whole milk, cream, ice cream, egg yolks, and full-fat cheeses.  Healthier desserts include angel food cake, ginger snaps, animal crackers, hard candy, popsicles, and low-fat or nonfat frozen yogurt. Avoid pastries, cakes, pies, and cookies.  Exercise  Follow your exercise program as told by your health care provider. A regular program: ? Helps to decrease LDL and raise HDL. ? Helps with weight control.  Do things that increase your activity level, such as gardening, walking, and taking the stairs.  Ask your health care provider about ways that you can be more active in your daily  life.  Medicine  Take over-the-counter and prescription medicines only as told by your health care provider. ? Medicine may be prescribed by your health care provider to help lower cholesterol and decrease the risk for heart disease. This is usually done if diet and exercise have failed to bring down cholesterol levels. ? If you have several risk factors, you may need medicine even if your levels are normal.  This information is not intended to replace advice given to you by your health care provider. Make sure you discuss any questions you have with your health care provider. Document Released: 01/02/2001 Document Revised: 11/05/2015 Document Reviewed: 10/08/2015 Elsevier Interactive Patient Education  2018 [employer].      IF you received an x-ray today, you will receive an invoice from [facility]. Please contact [facility] at [phone] with questions or concerns regarding your invoice.   IF you received labwork today, you will receive an invoice from [facility]. Please contact [facility] at [phone] with questions or concerns regarding your invoice.   Our billing staff will not be able to assist you with questions regarding bills from  these companies.  You will be contacted with the lab results as soon as they are available. The fastest way to get your results is to activate your My Chart account. Instructions are located on the last page of this paperwork. If you have not heard from [place] regarding the results in 2 weeks, please contact this office.

## 2017-07-19 DIAGNOSIS — I351 Nonrheumatic aortic (valve) insufficiency: Secondary | ICD-10-CM | POA: Diagnosis not present

## 2017-07-19 DIAGNOSIS — I119 Hypertensive heart disease without heart failure: Secondary | ICD-10-CM | POA: Diagnosis not present

## 2017-07-19 DIAGNOSIS — R9431 Abnormal electrocardiogram [ECG] [EKG]: Secondary | ICD-10-CM | POA: Diagnosis not present

## 2017-07-19 DIAGNOSIS — E785 Hyperlipidemia, unspecified: Secondary | ICD-10-CM | POA: Diagnosis not present

## 2017-07-22 DIAGNOSIS — I119 Hypertensive heart disease without heart failure: Secondary | ICD-10-CM | POA: Diagnosis not present

## 2017-08-13 ENCOUNTER — Ambulatory Visit: Payer: BLUE CROSS/BLUE SHIELD | Admitting: Urgent Care

## 2017-08-23 ENCOUNTER — Encounter: Payer: Self-pay | Admitting: Urgent Care

## 2017-08-23 ENCOUNTER — Ambulatory Visit: Payer: BLUE CROSS/BLUE SHIELD | Admitting: Urgent Care

## 2017-08-23 ENCOUNTER — Other Ambulatory Visit: Payer: Self-pay

## 2017-08-23 VITALS — BP 150/72 | HR 94 | Temp 99.3°F | Resp 16 | Ht 72.5 in | Wt 204.6 lb

## 2017-08-23 DIAGNOSIS — R030 Elevated blood-pressure reading, without diagnosis of hypertension: Secondary | ICD-10-CM | POA: Diagnosis not present

## 2017-08-23 DIAGNOSIS — E782 Mixed hyperlipidemia: Secondary | ICD-10-CM | POA: Diagnosis not present

## 2017-08-23 DIAGNOSIS — I10 Essential (primary) hypertension: Secondary | ICD-10-CM

## 2017-08-23 MED ORDER — AMLODIPINE BESYLATE 10 MG PO TABS
10.0000 mg | ORAL_TABLET | Freq: Every day | ORAL | 1 refills | Status: DC
Start: 2017-08-23 — End: 2018-04-21

## 2017-08-23 NOTE — Patient Instructions (Addendum)
Take 2 tablets of amlodipine (10mg ) once daily. Check your blood pressure once a week. If your blood pressure stays above 829'H systolic (top number), then come back for a blood pressure recheck before 3 months. Your goal for you blood pressure should be 120's/80's.    Hypertension Hypertension, commonly called high blood pressure, is when the force of blood pumping through the arteries is too strong. The arteries are the blood vessels that carry blood from the heart throughout the body. Hypertension forces the heart to work harder to pump blood and may cause arteries to become narrow or stiff. Having untreated or uncontrolled hypertension can cause heart attacks, strokes, kidney disease, and other problems. A blood pressure reading consists of a higher number over a lower number. Ideally, your blood pressure should be below 120/80. The first ("top") number is called the systolic pressure. It is a measure of the pressure in your arteries as your heart beats. The second ("bottom") number is called the diastolic pressure. It is a measure of the pressure in your arteries as the heart relaxes. What are the causes? The cause of this condition is not known. What increases the risk? Some risk factors for high blood pressure are under your control. Others are not. Factors you can change  Smoking.  Having type 2 diabetes mellitus, high cholesterol, or both.  Not getting enough exercise or physical activity.  Being overweight.  Having too much fat, sugar, calories, or salt (sodium) in your diet.  Drinking too much alcohol. Factors that are difficult or impossible to change  Having chronic kidney disease.  Having a family history of high blood pressure.  Age. Risk increases with age.  Race. You may be at higher risk if you are African-American.  Gender. Men are at higher risk than women before age 55. After age 64, women are at higher risk than men.  Having obstructive sleep  apnea.  Stress. What are the signs or symptoms? Extremely high blood pressure (hypertensive crisis) may cause:  Headache.  Anxiety.  Shortness of breath.  Nosebleed.  Nausea and vomiting.  Severe chest pain.  Jerky movements you cannot control (seizures).  How is this diagnosed? This condition is diagnosed by measuring your blood pressure while you are seated, with your arm resting on a surface. The cuff of the blood pressure monitor will be placed directly against the skin of your upper arm at the level of your heart. It should be measured at least twice using the same arm. Certain conditions can cause a difference in blood pressure between your right and left arms. Certain factors can cause blood pressure readings to be lower or higher than normal (elevated) for a short period of time:  When your blood pressure is higher when you are in a health care provider's office than when you are at home, this is called white coat hypertension. Most people with this condition do not need medicines.  When your blood pressure is higher at home than when you are in a health care provider's office, this is called masked hypertension. Most people with this condition may need medicines to control blood pressure.  If you have a high blood pressure reading during one visit or you have normal blood pressure with other risk factors:  You may be asked to return on a different day to have your blood pressure checked again.  You may be asked to monitor your blood pressure at home for 1 week or longer.  If you are diagnosed with  hypertension, you may have other blood or imaging tests to help your health care provider understand your overall risk for other conditions. How is this treated? This condition is treated by making healthy lifestyle changes, such as eating healthy foods, exercising more, and reducing your alcohol intake. Your health care provider may prescribe medicine if lifestyle changes are  not enough to get your blood pressure under control, and if:  Your systolic blood pressure is above 130.  Your diastolic blood pressure is above 80.  Your personal target blood pressure may vary depending on your medical conditions, your age, and other factors. Follow these instructions at home: Eating and drinking  Eat a diet that is high in fiber and potassium, and low in sodium, added sugar, and fat. An example eating plan is called the DASH (Dietary Approaches to Stop Hypertension) diet. To eat this way: ? Eat plenty of fresh fruits and vegetables. Try to fill half of your plate at each meal with fruits and vegetables. ? Eat whole grains, such as whole wheat pasta, brown rice, or whole grain bread. Fill about one quarter of your plate with whole grains. ? Eat or drink low-fat dairy products, such as skim milk or low-fat yogurt. ? Avoid fatty cuts of meat, processed or cured meats, and poultry with skin. Fill about one quarter of your plate with lean proteins, such as fish, chicken without skin, beans, eggs, and tofu. ? Avoid premade and processed foods. These tend to be higher in sodium, added sugar, and fat.  Reduce your daily sodium intake. Most people with hypertension should eat less than 1,500 mg of sodium a day.  Limit alcohol intake to no more than 1 drink a day for nonpregnant women and 2 drinks a day for men. One drink equals 12 oz of beer, 5 oz of wine, or 1 oz of hard liquor. Lifestyle  Work with your health care provider to maintain a healthy body weight or to lose weight. Ask what an ideal weight is for you.  Get at least 30 minutes of exercise that causes your heart to beat faster (aerobic exercise) most days of the week. Activities may include walking, swimming, or biking.  Include exercise to strengthen your muscles (resistance exercise), such as pilates or lifting weights, as part of your weekly exercise routine. Try to do these types of exercises for 30 minutes at  least 3 days a week.  Do not use any products that contain nicotine or tobacco, such as cigarettes and e-cigarettes. If you need help quitting, ask your health care provider.  Monitor your blood pressure at home as told by your health care provider.  Keep all follow-up visits as told by your health care provider. This is important. Medicines  Take over-the-counter and prescription medicines only as told by your health care provider. Follow directions carefully. Blood pressure medicines must be taken as prescribed.  Do not skip doses of blood pressure medicine. Doing this puts you at risk for problems and can make the medicine less effective.  Ask your health care provider about side effects or reactions to medicines that you should watch for. Contact a health care provider if:  You think you are having a reaction to a medicine you are taking.  You have headaches that keep coming back (recurring).  You feel dizzy.  You have swelling in your ankles.  You have trouble with your vision. Get help right away if:  You develop a severe headache or confusion.  You have  unusual weakness or numbness.  You feel faint.  You have severe pain in your chest or abdomen.  You vomit repeatedly.  You have trouble breathing. Summary  Hypertension is when the force of blood pumping through your arteries is too strong. If this condition is not controlled, it may put you at risk for serious complications.  Your personal target blood pressure may vary depending on your medical conditions, your age, and other factors. For most people, a normal blood pressure is less than 120/80.  Hypertension is treated with lifestyle changes, medicines, or a combination of both. Lifestyle changes include weight loss, eating a healthy, low-sodium diet, exercising more, and limiting alcohol. This information is not intended to replace advice given to you by your health care provider. Make sure you discuss any  questions you have with your health care provider. Document Released: 04/09/2005 Document Revised: 03/07/2016 Document Reviewed: 03/07/2016 Elsevier Interactive Patient Education  2018 [employer].     IF you received an x-ray today, you will receive an invoice from [facility]. Please contact [facility] at [phone] with questions or concerns regarding your invoice.   IF you received labwork today, you will receive an invoice from [facility]. Please contact [facility] at [phone] with questions or concerns regarding your invoice.   Our billing staff will not be able to assist you with questions regarding bills from these companies.  You will be contacted with the lab results as soon as they are available. The fastest way to get your results is to activate your My Chart account. Instructions are located on the last page of this paperwork. If you have not heard from [place] regarding the results in 2 weeks, please contact this office.

## 2017-08-23 NOTE — Progress Notes (Signed)
    MRN: [id number] DOB: June 24, 1960  Subjective:   Randy [name] is a 57 y.o. male presenting for follow up on Hypertension. Currently managed with amlodipine.  Avoids salt in diet, is exercising. Denies dizziness, chronic headache, blurred vision, chest pain, shortness of breath, heart racing, palpitations, nausea, vomiting, abdominal pain, hematuria, lower leg swelling. Denies smoking cigarettes or drinking alcohol.   Randy [name] has a current medication list which includes the following prescription(s): amlodipine and atorvastatin. Also has No Known Allergies.  Randy [name]  has a past medical history of Hyperlipidemia. Also  has a past surgical history that includes Appendectomy.  Objective:   Vitals: BP (!) 150/72 (BP Location: Left Arm, Patient Position: Sitting, Cuff Size: Normal)   Pulse 94   Temp 99.3 F (37.4 C) (Oral)   Resp 16   Ht 6' 0.5" (1.842 m)   Wt 204 lb 9.6 oz (92.8 kg)   SpO2 97%   BMI 27.37 kg/m   BP Readings from Last 3 Encounters:  08/23/17 (!) 150/72  07/13/17 (!) 154/100  06/27/17 (!) 158/109    Physical Exam  Constitutional: He is oriented to person, place, and time. He appears well-developed and well-nourished.  Cardiovascular: Normal rate, regular rhythm and intact distal pulses. Exam reveals no gallop and no friction rub.  No murmur heard. Pulmonary/Chest: No respiratory distress. He has no wheezes. He has no rales.  Neurological: He is alert and oriented to person, place, and time.   Assessment and Plan :   Essential hypertension  Elevated blood pressure reading  Mixed hyperlipidemia  We will increase his amlodipine from 5 mg to 10 mg.  Encourage patient to continue healthy lifestyle.  Patient is anxious about risks of hypertension given his family history.  I did my best to reassure patient that he is doing a good job and will continue to work on his blood pressure together.  Follow-up in 4 weeks.  Jaynee Eagles, PA-C [facility] [place] [phone] 08/23/2017  4:51 PM

## 2017-08-26 ENCOUNTER — Encounter: Payer: Self-pay | Admitting: Urgent Care

## 2017-09-16 ENCOUNTER — Encounter: Payer: Self-pay | Admitting: Family Medicine

## 2017-09-18 ENCOUNTER — Encounter: Payer: Self-pay | Admitting: Urgent Care

## 2017-12-18 ENCOUNTER — Other Ambulatory Visit: Payer: Self-pay | Admitting: Urgent Care

## 2018-01-17 ENCOUNTER — Other Ambulatory Visit: Payer: Self-pay | Admitting: Urgent Care

## 2018-03-12 ENCOUNTER — Other Ambulatory Visit: Payer: Self-pay | Admitting: Urgent Care

## 2018-04-13 ENCOUNTER — Other Ambulatory Visit: Payer: Self-pay | Admitting: Urgent Care

## 2018-04-21 ENCOUNTER — Other Ambulatory Visit: Payer: Self-pay | Admitting: Urgent Care

## 2018-05-10 ENCOUNTER — Other Ambulatory Visit: Payer: Self-pay | Admitting: Urgent Care

## 2018-05-12 NOTE — Telephone Encounter (Signed)
Pt needs to establish care with new provider. Refilled for 30 days.

## 2018-05-16 ENCOUNTER — Other Ambulatory Visit: Payer: Self-pay | Admitting: Urgent Care

## 2018-06-08 ENCOUNTER — Other Ambulatory Visit: Payer: Self-pay | Admitting: Urgent Care

## 2018-06-14 ENCOUNTER — Other Ambulatory Visit: Payer: Self-pay | Admitting: Urgent Care

## 2018-06-21 ENCOUNTER — Other Ambulatory Visit: Payer: Self-pay

## 2018-06-21 ENCOUNTER — Encounter: Payer: Self-pay | Admitting: Family Medicine

## 2018-06-21 ENCOUNTER — Ambulatory Visit: Payer: BLUE CROSS/BLUE SHIELD | Admitting: Family Medicine

## 2018-06-21 VITALS — BP 142/78 | HR 77 | Temp 98.0°F | Resp 16 | Ht 72.0 in | Wt 213.0 lb

## 2018-06-21 DIAGNOSIS — N522 Drug-induced erectile dysfunction: Secondary | ICD-10-CM

## 2018-06-21 DIAGNOSIS — Z125 Encounter for screening for malignant neoplasm of prostate: Secondary | ICD-10-CM | POA: Diagnosis not present

## 2018-06-21 DIAGNOSIS — R739 Hyperglycemia, unspecified: Secondary | ICD-10-CM | POA: Diagnosis not present

## 2018-06-21 DIAGNOSIS — R0981 Nasal congestion: Secondary | ICD-10-CM | POA: Diagnosis not present

## 2018-06-21 DIAGNOSIS — I10 Essential (primary) hypertension: Secondary | ICD-10-CM | POA: Diagnosis not present

## 2018-06-21 MED ORDER — FLUTICASONE PROPIONATE 50 MCG/ACT NA SUSP: 2.0000 | Freq: Every day | NASAL | 6 refills | Status: DC

## 2018-06-21 MED ORDER — AMLODIPINE BESYLATE 10 MG PO TABS: 10.0000 mg | ORAL_TABLET | Freq: Every day | ORAL | 0 refills | Status: DC

## 2018-06-21 NOTE — Progress Notes (Signed)
Established Patient Office Visit  Subjective:  Patient ID: Randy [name], male    DOB: 05-Feb-1961  Age: 58 y.o. MRN: [id number]  CC:  Chief Complaint  Patient presents with  . Sinus Problem    pt state he has been having some nasal drainage/congestion, with some throat swelling x 1 month     HPI Randy [name] presents for   Patient is here for sinus congestion for 3-4 weeks now He states that he has been having nasal drainage and congestion He has been taking mucinex for the postnasal drip He states taht the uvela is swalloen and there is mucus stuck to the back of the throat No pressure around the teeth or eyes He states that he gets a blocked right nostril He denies fevers, chills, tinnitus or dizziness  Hypertension: Patient here for follow-up of elevated blood pressure. He is not exercising and is adherent to low salt diet.  Blood pressure is well controlled at home. Cardiac symptoms none. Patient denies chest pressure/discomfort, dyspnea, exertional chest pressure/discomfort, irregular heart beat, near-syncope and paroxysmal nocturnal dyspnea.  Cardiovascular risk factors: advanced age (older than 70 for men, 12 for women), hypertension, male gender and sedentary lifestyle. Use of agents associated with hypertension: none. History of target organ damage: none. BP Readings from Last 3 Encounters:  06/21/18 (!) 142/78  08/23/17 (!) 150/72  07/13/17 (!) 154/100   Erectile Dysfunction He has some difficulty with erections No muscle cramps No back pains He does not take any meds for it He is not exercising    Past Medical History:  Diagnosis Date  . Hyperlipidemia     Past Surgical History:  Procedure Laterality Date  . APPENDECTOMY      Family History  Problem Relation Age of Onset  . Diabetes Mother   . Hypertension Father   . Kidney disease Father     Social History   Socioeconomic History  . Marital status: Married    Spouse name: Not on file  .  Number of children: Not on file  . Years of education: Not on file  . Highest education level: Not on file  Occupational History  . Not on file  Social Needs  . Financial resource strain: Not on file  . Food insecurity:    Worry: Not on file    Inability: Not on file  . Transportation needs:    Medical: Not on file    Non-medical: Not on file  Tobacco Use  . Smoking status: Never Smoker  . Smokeless tobacco: Never Used  Substance and Sexual Activity  . Alcohol use: No    Alcohol/week: 0.0 standard drinks  . Drug use: No  . Sexual activity: Not on file  Lifestyle  . Physical activity:    Days per week: Not on file    Minutes per session: Not on file  . Stress: Not on file  Relationships  . Social connections:    Talks on phone: Not on file    Gets together: Not on file    Attends religious service: Not on file    Active member of club or organization: Not on file    Attends meetings of clubs or organizations: Not on file    Relationship status: Not on file  . Intimate partner violence:    Fear of current or ex partner: Not on file    Emotionally abused: Not on file    Physically abused: Not on file    Forced sexual  activity: Not on file  Other Topics Concern  . Not on file  Social History Narrative  . Not on file    Outpatient Medications Prior to Visit  Medication Sig Dispense Refill  . atorvastatin (LIPITOR) 20 MG tablet TAKE 1 TABLET BY MOUTH EVERY DAY 30 tablet 0  . amLODipine (NORVASC) 10 MG tablet TAKE 1 TABLET BY MOUTH EVERY DAY 30 tablet 0   No facility-administered medications prior to visit.     No Known Allergies  ROS Review of Systems Review of Systems  Constitutional: Negative for activity change, appetite change, chills and fever.  HENT: see hpi Respiratory: see hpi Gastrointestinal: Negative for diarrhea, nausea and vomiting.  Genitourinary: Negative for difficulty urinating, dysuria, flank pain and hematuria.  Musculoskeletal: Negative for  back pain, joint swelling and neck pain.  Neurological: Negative for dizziness, speech difficulty, light-headedness and numbness.  See HPI. All other review of systems negative.      Objective:    Physical Exam  BP (!) 142/78   Pulse 77   Temp 98 F (36.7 C) (Oral)   Resp 16   Ht 6' (1.829 m)   Wt 213 lb (96.6 kg)   SpO2 98%   BMI 28.89 kg/m  Wt Readings from Last 3 Encounters:  06/21/18 213 lb (96.6 kg)  08/23/17 204 lb 9.6 oz (92.8 kg)  07/13/17 204 lb 12.8 oz (92.9 kg)   General: alert, oriented, in NAD Head: normocephalic, atraumatic, no sinus tenderness Eyes: EOM intact, no scleral icterus or conjunctival injection Ears: TM clear bilaterally Nose: mucosa nonerythematous, nonedematous Throat: no pharyngeal exudate or erythema Lymph: no posterior auricular, submental or cervical lymph adenopathy Heart: normal rate, normal sinus rhythm, no murmurs Lungs: clear to auscultation bilaterally, no wheezing    There are no preventive care reminders to display for this patient.  There are no preventive care reminders to display for this patient.  Lab Results  Component Value Date   TSH 0.457 06/27/2017   Lab Results  Component Value Date   WBC 4.4 06/27/2017   HGB 15.1 06/27/2017   HCT 43.7 06/27/2017   MCV 88 06/27/2017   PLT 317 06/27/2017   Lab Results  Component Value Date   NA 137 06/27/2017   K 4.3 06/27/2017   CO2 22 06/27/2017   GLUCOSE 111 (H) 06/27/2017   BUN 13 06/27/2017   CREATININE 1.09 06/27/2017   BILITOT 0.6 06/27/2017   ALKPHOS 112 06/27/2017   AST 21 06/27/2017   ALT 18 06/27/2017   PROT 8.1 06/27/2017   ALBUMIN 4.4 06/27/2017   CALCIUM 10.1 06/27/2017   ANIONGAP 14 05/31/2011   Lab Results  Component Value Date   CHOL 273 (H) 06/27/2017   Lab Results  Component Value Date   HDL 44 06/27/2017   Lab Results  Component Value Date   LDLCALC 212 (H) 06/27/2017   Lab Results  Component Value Date   TRIG 87 06/27/2017   Lab  Results  Component Value Date   CHOLHDL 6.2 (H) 06/27/2017   No results found for: HGBA1C    Assessment & Plan:   Problem List Items Addressed This Visit      Cardiovascular and Mediastinum   Essential hypertension -  Refilled a months supply  Pt to return for labs Follow up in one month   Relevant Medications   amLODipine (NORVASC) 10 MG tablet   Other Relevant Orders   Lipid panel   CMP14+EGFR   Microalbumin, urine  Other Visit Diagnoses    Sinus congestion    -  Primary Advised flonase and otc claritin No concern about enlarged uvula   Relevant Medications   fluticasone (FLONASE) 50 MCG/ACT nasal spray   Hyperglycemia    - reviewed previous CMP and glucose was elevated Will screen for DM   Relevant Orders   Hemoglobin A1c   Screening for malignant neoplasm of prostate       Relevant Orders   PSA   Drug-induced erectile dysfunction    -  Pt on amlodipine Will check labs Will discussed cialis at next visit   Relevant Orders   CMP14+EGFR   Hemoglobin A1c      Meds ordered this encounter  Medications  . amLODipine (NORVASC) 10 MG tablet    Sig: Take 1 tablet (10 mg total) by mouth daily.    Dispense:  30 tablet    Refill:  0    Office visit needed for additional refills  . DISCONTD: fluticasone (FLONASE) 50 MCG/ACT nasal spray    Sig: Place 2 sprays into both nostrils daily.    Dispense:  16 g    Refill:  6  . fluticasone (FLONASE) 50 MCG/ACT nasal spray    Sig: Place 2 sprays into both nostrils daily.    Dispense:  16 g    Refill:  6    Follow-up: Return in about 4 weeks (around 07/19/2018) for any saturday after March 7 for physical exam with Dr. Gretchen Short .    Forrest Moron, MD

## 2018-06-21 NOTE — Patient Instructions (Addendum)
If you have lab work done today you will be contacted with your lab results within the next 2 weeks.  If you have not heard from [place] then please contact us. The fastest way to get your results is to register for My Chart.   IF you received an x-ray today, you will receive an invoice from [facility]. Please contact [facility] at [phone] with questions or concerns regarding your invoice.   IF you received labwork today, you will receive an invoice from [place]. Please contact [facility] at [phone] with questions or concerns regarding your invoice.   Our billing staff will not be able to assist you with questions regarding bills from these companies.  You will be contacted with the lab results as soon as they are available. The fastest way to get your results is to activate your My Chart account. Instructions are located on the last page of this paperwork. If you have not heard from [place] regarding the results in 2 weeks, please contact this office.      Prostate Cancer Screening  The prostate is a walnut-sized gland that is located below the bladder and in front of the rectum in males. The function of the prostate (prostate gland) is to add fluid to semen during ejaculation. Prostate cancer is the second most common type of cancer in men. A screening test for cancer is a test that is done before cancer symptoms start. Screening can help to identify cancer at an early stage, when the cancer can be treated more easily. The recommended prostate cancer screening test is a blood test called the prostate-specific antigen (PSA) test. PSA is a protein that is made in the prostate. As you age, your prostate naturally produces more PSA. Abnormally high PSA levels may be caused by:  Prostate cancer.  An enlarged prostate that is not caused by cancer (benign prostatic hyperplasia, BPH). This condition is very common in older men.  A prostate gland infection  (prostatitis).  Medicines to assist with hair growth, such as finasteride. Depending on the PSA results, you may need more tests, such as:  A physical exam to check the size of your prostate gland.  Blood and imaging tests.  A procedure to remove tissue samples from your prostate gland for testing (biopsy). Who should have screening? Screening recommendations vary based on age.  If you are younger than age 58, screening is not recommended.  If you are age 18-54 and you have no risk factors, screening is not recommended.  If you are younger than age 3, ask your health care provider if you need screening if you have one of these risk factors: ? Being of African-American descent. ? Having a family history of prostate cancer.  If you are age 42-69, talk with your health care provider about your need for screening and how often screening should be done.  If you are older than age 53, screening is not recommended. This is because the risks that screening can cause are greater than the benefits that it may provide (risks outweigh the benefits). If you are at high risk for prostate cancer, your health care provider may recommend that you have screenings more often or start screening at a younger age. You may be at high risk if you:  Are older than age 33.  Are African-American.  Have a father, brother, or uncle who has been diagnosed with prostate cancer. The risk may be higher if your family member's cancer occurred at  an early age. What are the benefits of screening? There is a small chance that screening may lower your risk of dying from prostate cancer. The chance is small because prostate cancer is typically a slow-growing cancer, and most men with prostate cancer die from a different cause. What are the risks of screening? The main risk of prostate cancer screening is diagnosing and treating prostate cancer that would never have caused any symptoms or problems (overdiagnosis and  overtreatment). PSA screening cannot tell you if your PSA is high due to cancer or a different cause. A prostate biopsy is the only procedure to diagnose prostate cancer. Even the results of a biopsy may not tell you if your cancer needs to be treated. Slow-growing prostate cancer may not need any treatment other than monitoring, so diagnosing and treating it may cause unnecessary stress or other side effects. A prostate biopsy may also cause:  Infection or fever.  A false negative. This is a result that shows that you do not have prostate cancer when you actually do have prostate cancer. Questions to ask your health care provider  When should I start prostate cancer screening?  What is my risk for prostate cancer?  How often do I need screening?  What type of screening tests do I need?  How do I get my test results?  What do my results mean?  Do I need treatment? Contact a health care provider if:  You have difficulty urinating.  You have pain when you urinate or ejaculate.  You have blood in your urine or semen.  You have pain in your back or in the area of your prostate.  You have trouble getting or maintaining an erection (erectile dysfunction, ED). Summary  Prostate cancer is a common type of cancer in men. The prostate (prostate gland) is located below the bladder and in front of the rectum. This gland adds fluid to semen during ejaculation.  Prostate cancer screening may identify cancer at an early stage, when the cancer can be treated more easily.  The prostate-specific antigen (PSA) test is the recommended screening test for prostate cancer.  Discuss the risks and benefits of prostate cancer screening with your health care provider. If you are age 25 or older, screening is likely to lead to more risks than benefits (risks outweigh the benefits). This information is not intended to replace advice given to you by your health care provider. Make sure you discuss any  questions you have with your health care provider. Document Released: 01/18/2017 Document Revised: 01/18/2017 Document Reviewed: 01/18/2017 Elsevier Interactive Patient Education  2019 [employer].

## 2018-07-08 ENCOUNTER — Other Ambulatory Visit: Payer: Self-pay | Admitting: Urgent Care

## 2018-07-12 ENCOUNTER — Encounter: Payer: BLUE CROSS/BLUE SHIELD | Admitting: Family Medicine

## 2018-07-13 ENCOUNTER — Other Ambulatory Visit: Payer: Self-pay | Admitting: Urgent Care

## 2018-07-13 DIAGNOSIS — I10 Essential (primary) hypertension: Secondary | ICD-10-CM

## 2018-07-14 NOTE — Telephone Encounter (Signed)
Requested medication (s) are due for refill today: yes  Requested medication (s) are on the active medication list: yes  Last refill:  06/21/18 #30  Future visit scheduled: No, no show for appt scheduled on 07/12/18  Notes to clinic:  Unable to refill per protocol    Requested Prescriptions  Pending Prescriptions Disp Refills   amLODipine (NORVASC) 10 MG tablet [Pharmacy Med Name: AMLODIPINE BESYLATE 10 MG TAB] 30 tablet 0    Sig: TAKE 1 TABLET BY MOUTH EVERY DAY     Cardiovascular:  Calcium Channel Blockers Failed - 07/13/2018  9:35 AM      Failed - Last BP in normal range    BP Readings from Last 1 Encounters:  06/21/18 (!) 142/78         Failed - Valid encounter within last 6 months    Recent Outpatient Visits          3 weeks ago Sinus congestion   [facility] [place], [place]   10 months ago Essential hypertension   Primary Care at [place], [place]   1 year ago Essential hypertension   Primary Care at [facility], [place], [place]   1 year ago Annual physical exam   Primary Care at [place], [place]   2 years ago Allergic rhinitis, unspecified allergic rhinitis type   Primary Care at [facility], [place], [place]

## 2018-07-25 ENCOUNTER — Other Ambulatory Visit: Payer: Self-pay | Admitting: Urgent Care

## 2018-08-03 ENCOUNTER — Other Ambulatory Visit: Payer: Self-pay | Admitting: Family Medicine

## 2018-08-07 ENCOUNTER — Other Ambulatory Visit: Payer: Self-pay | Admitting: Family Medicine

## 2018-08-07 DIAGNOSIS — I10 Essential (primary) hypertension: Secondary | ICD-10-CM

## 2018-08-07 NOTE — Telephone Encounter (Signed)
Requested medication (s) are due for refill today:  yes  Requested medication (s) are on the active medication list:  yes  Future visit scheduled:  no  Last Refill: 07/14/18; #15; no refills  Note to clinic:  Called pt. Re: need to schedule Virtual appt. To continue to receive medication refills; spoke with wife; she will have pt. Call to schedule appt. With Dr. Nolon Rod, when he gets home.   Requested Prescriptions  Pending Prescriptions Disp Refills   amLODipine (NORVASC) 10 MG tablet [Pharmacy Med Name: AMLODIPINE BESYLATE 10 MG TAB] 30 tablet 0    Sig: TAKE 1 TABLET BY MOUTH EVERY DAY     Cardiovascular:  Calcium Channel Blockers Failed - 08/07/2018  2:32 PM      Failed - Last BP in normal range    BP Readings from Last 1 Encounters:  06/21/18 (!) 142/78         Failed - Valid encounter within last 6 months    Recent Outpatient Visits          1 month ago Sinus congestion   [facility] at [place], [place]   11 months ago Essential hypertension   Primary [facility] at [place], [place]   1 year ago Essential hypertension   Primary Care at [place], [place]   1 year ago Annual physical exam   Primary Care at [place], [place]   2 years ago Allergic rhinitis, unspecified allergic rhinitis type   Primary Care at [place], [place]

## 2018-08-12 ENCOUNTER — Telehealth: Payer: BLUE CROSS/BLUE SHIELD | Admitting: Family Medicine

## 2018-08-15 ENCOUNTER — Telehealth: Payer: Self-pay | Admitting: Family Medicine

## 2018-08-15 ENCOUNTER — Other Ambulatory Visit: Payer: Self-pay

## 2018-08-15 ENCOUNTER — Telehealth (INDEPENDENT_AMBULATORY_CARE_PROVIDER_SITE_OTHER): Payer: BLUE CROSS/BLUE SHIELD | Admitting: Family Medicine

## 2018-08-15 ENCOUNTER — Encounter: Payer: Self-pay | Admitting: Family Medicine

## 2018-08-15 VITALS — Ht 74.0 in | Wt 206.0 lb

## 2018-08-15 DIAGNOSIS — I10 Essential (primary) hypertension: Secondary | ICD-10-CM

## 2018-08-15 DIAGNOSIS — E782 Mixed hyperlipidemia: Secondary | ICD-10-CM | POA: Diagnosis not present

## 2018-08-15 MED ORDER — ATORVASTATIN CALCIUM 20 MG PO TABS: 20.0000 mg | ORAL_TABLET | Freq: Every day | ORAL | 3 refills | Status: DC

## 2018-08-15 NOTE — Progress Notes (Signed)
Contacted and triaged patient for appointment. Patient needs refill on Amlodipine medication. Patient does not have any additional complaints.

## 2018-08-15 NOTE — Progress Notes (Signed)
Telemedicine Encounter- SOAP NOTE Established Patient  This telephone encounter was conducted with the patient's (or proxy's) verbal consent via audio telecommunications: yes/no: Yes Patient was instructed to have this encounter in a suitably private space; and to only have persons present to whom they give permission to participate. In addition, patient identity was confirmed by use of name plus two identifiers (DOB and address).  I discussed the limitations, risks, security and privacy concerns of performing an evaluation and management service by telephone and the availability of in person appointments. I also discussed with the patient that there may be a patient responsible charge related to this service. The patient expressed understanding and agreed to proceed.   I spent a total of TIME; 0 MIN TO 60 MIN: 20 minutes talking with the patient or their proxy.  CC: hypertension and  Hyperlipidemia  Subjective   Randy [name] is a 58 y.o. established patient. Telephone visit today for  HPI  Hypertension: Patient here for follow-up of elevated blood pressure. He is exercising on the treadmill and is adherent to low salt diet.  Blood pressure is not checked at home. Cardiac symptoms none. Patient denies chest pain, chest pressure/discomfort, dyspnea, fatigue, irregular heart beat and lower extremity edema.  Cardiovascular risk factors: dyslipidemia, hypertension, male gender and obesity (BMI >= 30 kg/m2). Use of agents associated with hypertension: none. History of target organ damage: none.  He does not smoke.  BP Readings from Last 3 Encounters:  06/21/18 (!) 142/78  08/23/17 (!) 150/72  07/13/17 (!) 154/100   Pt reports that he is exercising on the treadmill  Body mass index is 26.45 kg/m.   Hyperlipidemia: Patient presents with hyperlipidemia.  He was tested because screening.  He is a nonsmoker.  He denies chest pains, palpitations, calf pain, weakness.  His family history of  heart disease is unknown.   Lab Results  Component Value Date   CHOL 273 (H) 06/27/2017   CHOL 258 (H) 02/11/2015   CHOL 295 (H) 11/10/2012   Lab Results  Component Value Date   HDL 44 06/27/2017   HDL 36 (L) 02/11/2015   HDL 37 (L) 11/10/2012   Lab Results  Component Value Date   LDLCALC 212 (H) 06/27/2017   LDLCALC 188 (H) 02/11/2015   LDLCALC 210 (H) 11/10/2012   Lab Results  Component Value Date   TRIG 87 06/27/2017   TRIG 171 (H) 02/11/2015   TRIG 239 (H) 11/10/2012   Lab Results  Component Value Date   CHOLHDL 6.2 (H) 06/27/2017   CHOLHDL 7.2 (H) 02/11/2015   CHOLHDL 8.0 11/10/2012   No results found for: LDLDIRECT  Patient Active Problem List   Diagnosis Date Noted  . Essential hypertension 02/11/2015    Past Medical History:  Diagnosis Date  . Hyperlipidemia     Current Outpatient Medications  Medication Sig Dispense Refill  . amLODipine (NORVASC) 10 MG tablet TAKE 1 TABLET BY MOUTH EVERY DAY 30 tablet 0  . atorvastatin (LIPITOR) 20 MG tablet Take 1 tablet (20 mg total) by mouth daily. 90 tablet 3  . fluticasone (FLONASE) 50 MCG/ACT nasal spray Place 2 sprays into both nostrils daily. 16 g 6  . Multiple Vitamin (MULTIVITAMIN) tablet Take 1 tablet by mouth daily.     No current facility-administered medications for this visit.     No Known Allergies  Social History   Socioeconomic History  . Marital status: Married    Spouse name: Not on file  .  Number of children: Not on file  . Years of education: Not on file  . Highest education level: Not on file  Occupational History  . Not on file  Social Needs  . Financial resource strain: Not on file  . Food insecurity:    Worry: Not on file    Inability: Not on file  . Transportation needs:    Medical: Not on file    Non-medical: Not on file  Tobacco Use  . Smoking status: Never Smoker  . Smokeless tobacco: Never Used  Substance and Sexual Activity  . Alcohol use: No    Alcohol/week: 0.0  standard drinks  . Drug use: No  . Sexual activity: Not on file  Lifestyle  . Physical activity:    Days per week: Not on file    Minutes per session: Not on file  . Stress: Not on file  Relationships  . Social connections:    Talks on phone: Not on file    Gets together: Not on file    Attends religious service: Not on file    Active member of club or organization: Not on file    Attends meetings of clubs or organizations: Not on file    Relationship status: Not on file  . Intimate partner violence:    Fear of current or ex partner: Not on file    Emotionally abused: Not on file    Physically abused: Not on file    Forced sexual activity: Not on file  Other Topics Concern  . Not on file  Social History Narrative  . Not on file    ROS Review of Systems  Constitutional: Negative for activity change, appetite change, chills and fever.  HENT: Negative for congestion, nosebleeds, trouble swallowing and voice change.   Respiratory: Negative for cough, shortness of breath and wheezing.   Gastrointestinal: Negative for diarrhea, nausea and vomiting.  Genitourinary: Negative for difficulty urinating, dysuria, flank pain and hematuria.  Musculoskeletal: Negative for back pain, joint swelling and neck pain.  Neurological: Negative for dizziness, speech difficulty, light-headedness and numbness.  See HPI. All other review of systems negative.   Objective   Vitals as reported by the patient: Today's Vitals   08/15/18 1004  Weight: 206 lb (93.4 kg)  Height: 6\' 2"  (1.88 m)    Diagnoses and all orders for this visit:  Essential hypertension  Mixed hyperlipidemia  Other orders -     atorvastatin (LIPITOR) 20 MG tablet; Take 1 tablet (20 mg total) by mouth daily.     I discussed the assessment and treatment plan with the patient. The patient was provided an opportunity to ask questions and all were answered. The patient agreed with the plan and demonstrated an understanding  of the instructions.   The patient was advised to call back or seek an in-person evaluation if the symptoms worsen or if the condition fails to improve as anticipated.  I provided 20 minutes of non-face-to-face time during this encounter.  Forrest Moron, MD  Primary Care at [facility]

## 2018-08-15 NOTE — Telephone Encounter (Signed)
Pt returned call . Please call back to schedule 3 mo follow up

## 2018-08-15 NOTE — Telephone Encounter (Signed)
I called pt, his wife answered-I didn't leave her a message. He needs a nurse visit-labs-08/22/18 3p. He also needs ov for cpe in 3 months.

## 2018-08-17 MED ORDER — AMLODIPINE BESYLATE 10 MG PO TABS: 10.0000 mg | ORAL_TABLET | Freq: Every day | ORAL | 0 refills | Status: DC

## 2018-08-22 ENCOUNTER — Other Ambulatory Visit: Payer: Self-pay

## 2018-08-22 ENCOUNTER — Ambulatory Visit (INDEPENDENT_AMBULATORY_CARE_PROVIDER_SITE_OTHER): Payer: BLUE CROSS/BLUE SHIELD | Admitting: Family Medicine

## 2018-08-22 DIAGNOSIS — R739 Hyperglycemia, unspecified: Secondary | ICD-10-CM | POA: Diagnosis not present

## 2018-08-22 DIAGNOSIS — I10 Essential (primary) hypertension: Secondary | ICD-10-CM

## 2018-08-22 DIAGNOSIS — Z125 Encounter for screening for malignant neoplasm of prostate: Secondary | ICD-10-CM | POA: Diagnosis not present

## 2018-08-22 DIAGNOSIS — N522 Drug-induced erectile dysfunction: Secondary | ICD-10-CM | POA: Diagnosis not present

## 2018-08-23 LAB — LIPID PANEL
Chol/HDL Ratio: 4 ratio (ref 0.0–5.0)
Cholesterol, Total: 151 mg/dL (ref 100–199)
HDL: 38 mg/dL — ABNORMAL LOW (ref >39)
LDL Calculated: 93 mg/dL (ref 0–99)
Triglycerides: 101 mg/dL (ref 0–149)
VLDL Cholesterol Cal: 20 mg/dL (ref 5–40)

## 2018-08-23 LAB — HEMOGLOBIN A1C
Est. average glucose Bld gHb Est-mCnc: 114 mg/dL
Hgb A1c MFr Bld: 5.6 % (ref 4.8–5.6)

## 2018-08-23 LAB — CMP14+EGFR
ALT: 18 IU/L (ref 0–44)
AST: 17 IU/L (ref 0–40)
Albumin/Globulin Ratio: 1.5 (ref 1.2–2.2)
Albumin: 4.5 g/dL (ref 3.8–4.9)
Alkaline Phosphatase: 111 IU/L (ref 39–117)
BUN/Creatinine Ratio: 10 (ref 9–20)
BUN: 11 mg/dL (ref 6–24)
Bilirubin Total: 0.5 mg/dL (ref 0.0–1.2)
CO2: 20 mmol/L (ref 20–29)
Calcium: 9.9 mg/dL (ref 8.7–10.2)
Chloride: 102 mmol/L (ref 96–106)
Creatinine, Ser: 1.09 mg/dL (ref 0.76–1.27)
GFR calc Af Amer: 86 mL/min/{1.73_m2} (ref >59)
GFR calc non Af Amer: 74 mL/min/{1.73_m2} (ref >59)
Globulin, Total: 3.1 g/dL (ref 1.5–4.5)
Glucose: 104 mg/dL — ABNORMAL HIGH (ref 65–99)
Potassium: 4.4 mmol/L (ref 3.5–5.2)
Sodium: 137 mmol/L (ref 134–144)
Total Protein: 7.6 g/dL (ref 6.0–8.5)

## 2018-08-23 LAB — PSA: Prostate Specific Ag, Serum: 0.5 ng/mL (ref 0.0–4.0)

## 2018-08-23 LAB — MICROALBUMIN, URINE: Microalbumin, Urine: 4.1 ug/mL

## 2018-08-25 NOTE — Progress Notes (Signed)
Lm to call bk

## 2018-08-26 ENCOUNTER — Telehealth: Payer: Self-pay

## 2018-08-26 ENCOUNTER — Telehealth: Payer: Self-pay | Admitting: Family Medicine

## 2018-08-26 NOTE — Telephone Encounter (Signed)
Copied from [place] [id number]. Topic: General - Other >> Aug 25, 2018  3:51 PM Valla Leaver wrote: Reason for CRM: Patient's calling for lab results.

## 2018-08-26 NOTE — Telephone Encounter (Signed)
Pt calling for lab results.  Advised per stalling labs look good and his medications are working and he should come into the office in 3 months for bp check.  Pt agreeable.  Per pt he has a virtual visit with her and will be at work and would like her to call him on his cell phone at [phone].  Advised I would give this info to dr. Nolon Rod.  Pt appreciative. Dgaddy, CMA

## 2018-08-26 NOTE — Telephone Encounter (Signed)
Pt called in again requesting his lab results from 08/22/18.   Please advise.

## 2018-08-26 NOTE — Telephone Encounter (Signed)
Spoke with pt and lab results given. Dgaddy, CMA

## 2018-09-02 ENCOUNTER — Telehealth (INDEPENDENT_AMBULATORY_CARE_PROVIDER_SITE_OTHER): Payer: BLUE CROSS/BLUE SHIELD | Admitting: Family Medicine

## 2018-09-02 ENCOUNTER — Other Ambulatory Visit: Payer: Self-pay

## 2018-09-02 DIAGNOSIS — I10 Essential (primary) hypertension: Secondary | ICD-10-CM

## 2018-09-02 DIAGNOSIS — R140 Abdominal distension (gaseous): Secondary | ICD-10-CM

## 2018-09-02 DIAGNOSIS — E782 Mixed hyperlipidemia: Secondary | ICD-10-CM

## 2018-09-02 MED ORDER — AMLODIPINE BESYLATE 10 MG PO TABS: 10.0000 mg | ORAL_TABLET | Freq: Every day | ORAL | 1 refills | Status: DC

## 2018-09-02 MED ORDER — ATORVASTATIN CALCIUM 20 MG PO TABS: 20.0000 mg | ORAL_TABLET | Freq: Every day | ORAL | 3 refills | Status: DC

## 2018-09-02 NOTE — Progress Notes (Signed)
Telemedicine Encounter- SOAP NOTE Established Patient  This telephone encounter was conducted with the patient's (or proxy's) verbal consent via audio telecommunications: yes/no: Yes Patient was instructed to have this encounter in a suitably private space; and to only have persons present to whom they give permission to participate. In addition, patient identity was confirmed by use of name plus two identifiers (DOB and address).  I discussed the limitations, risks, security and privacy concerns of performing an evaluation and management service by telephone and the availability of in person appointments. I also discussed with the patient that there may be a patient responsible charge related to this service. The patient expressed understanding and agreed to proceed.  I spent a total of TIME; 0 MIN TO 60 MIN: 20 minutes talking with the patient or their proxy.  CC: hypertension, abdominal bloating  Subjective   Randy [name] is a 58 y.o. established patient. Telephone visit today for  HPI  Hypertension: Patient here for follow-up of elevated blood pressure. He is not exercising and is adherent to low salt diet.  Blood pressure is well controlled at home. Cardiac symptoms none. Patient denies chest pressure/discomfort, fatigue, lower extremity edema, near-syncope and palpitations.  Cardiovascular risk factors: hypertension.  BP Readings from Last 3 Encounters:  06/21/18 (!) 142/78  08/23/17 (!) 150/72  07/13/17 (!) 154/100    Dyslipidemia: Patient presents for evaluation of lipids.  Compliance with treatment thus far has been good.  A repeat fasting lipid profile was done.  The patient does not use medications that may worsen dyslipidemias (corticosteroids, progestins, anabolic steroids, diuretics, beta-blockers, amiodarone, cyclosporine, olanzapine). The patient exercises intermittently.  The patient is not known to have coexisting coronary artery disease.    Lab Results   Component Value Date   CHOL 151 08/22/2018   CHOL 273 (H) 06/27/2017   CHOL 258 (H) 02/11/2015   Lab Results  Component Value Date   HDL 38 (L) 08/22/2018   HDL 44 06/27/2017   HDL 36 (L) 02/11/2015   Lab Results  Component Value Date   LDLCALC 93 08/22/2018   LDLCALC 212 (H) 06/27/2017   LDLCALC 188 (H) 02/11/2015   Lab Results  Component Value Date   TRIG 101 08/22/2018   TRIG 87 06/27/2017   TRIG 171 (H) 02/11/2015   Lab Results  Component Value Date   CHOLHDL 4.0 08/22/2018   CHOLHDL 6.2 (H) 06/27/2017   CHOLHDL 7.2 (H) 02/11/2015   No results found for: LDLDIRECT   Abdominal Bloating Pt reports that he has been having some bloating and abdominal loud noises States that he feels like it is really loud when his belly moves He has bowel movements that are normal but reports flatulence that has increased He eats lots of greens He is not exercising as much in the past month Doesn't eat much dairy Has not done a food diary  Patient Active Problem List   Diagnosis Date Noted  . Essential hypertension 02/11/2015    Past Medical History:  Diagnosis Date  . Hyperlipidemia     Current Outpatient Medications  Medication Sig Dispense Refill  . amLODipine (NORVASC) 10 MG tablet Take 1 tablet (10 mg total) by mouth daily. 90 tablet 1  . atorvastatin (LIPITOR) 20 MG tablet Take 1 tablet (20 mg total) by mouth daily. 90 tablet 3  . fluticasone (FLONASE) 50 MCG/ACT nasal spray Place 2 sprays into both nostrils daily. 16 g 6  . Multiple Vitamin (MULTIVITAMIN) tablet Take 1 tablet by  mouth daily.     No current facility-administered medications for this visit.     No Known Allergies  Social History   Socioeconomic History  . Marital status: Married    Spouse name: Not on file  . Number of children: Not on file  . Years of education: Not on file  . Highest education level: Not on file  Occupational History  . Not on file  Social Needs  . Financial resource  strain: Not on file  . Food insecurity:    Worry: Not on file    Inability: Not on file  . Transportation needs:    Medical: Not on file    Non-medical: Not on file  Tobacco Use  . Smoking status: Never Smoker  . Smokeless tobacco: Never Used  Substance and Sexual Activity  . Alcohol use: No    Alcohol/week: 0.0 standard drinks  . Drug use: No  . Sexual activity: Not on file  Lifestyle  . Physical activity:    Days per week: Not on file    Minutes per session: Not on file  . Stress: Not on file  Relationships  . Social connections:    Talks on phone: Not on file    Gets together: Not on file    Attends religious service: Not on file    Active member of club or organization: Not on file    Attends meetings of clubs or organizations: Not on file    Relationship status: Not on file  . Intimate partner violence:    Fear of current or ex partner: Not on file    Emotionally abused: Not on file    Physically abused: Not on file    Forced sexual activity: Not on file  Other Topics Concern  . Not on file  Social History Narrative  . Not on file    ROS See hpi  Objective  Unable to perform exam due to the limitations of the telephone encounter  Vitals as reported by the patient: There were no vitals filed for this visit.  Diagnoses and all orders for this visit:  Mixed hyperlipidemia  Essential hypertension -     amLODipine (NORVASC) 10 MG tablet; Take 1 tablet (10 mg total) by mouth daily. Refilled meds Continue current meds Add back exercise  Abdominal bloating Keep a food diary, switch to a diet low in fatty foods and dairy, add metamucil first thing in the morning, add exercise Follow up in July with physical exam   Other orders -     atorvastatin (LIPITOR) 20 MG tablet; Take 1 tablet (20 mg total) by mouth daily.      I discussed the assessment and treatment plan with the patient. The patient was provided an opportunity to ask questions and all were  answered. The patient agreed with the plan and demonstrated an understanding of the instructions.   The patient was advised to call back or seek an in-person evaluation if the symptoms worsen or if the condition fails to improve as anticipated.  I provided 20 minutes of non-face-to-face time during this encounter.  Forrest Moron, MD  Primary Care at [facility]

## 2018-09-02 NOTE — Patient Instructions (Signed)
° ° ° °  If you have lab work done today you will be contacted with your lab results within the next 2 weeks.  If you have not heard from us then please contact us. The fastest way to get your results is to register for My Chart. ° ° °IF you received an x-ray today, you will receive an invoice from [facility]. Please contact [facility] at [phone] with questions or concerns regarding your invoice.  ° °IF you received labwork today, you will receive an invoice from [facility]. Please contact [facility] at [phone] with questions or concerns regarding your invoice.  ° °Our billing staff will not be able to assist you with questions regarding bills from these companies. ° °You will be contacted with the lab results as soon as they are available. The fastest way to get your results is to activate your My Chart account. Instructions are located on the last page of this paperwork. If you have not heard from us regarding the results in 2 weeks, please contact this office. °  ° ° ° °

## 2018-09-02 NOTE — Progress Notes (Signed)
Cc: Med refills per pt he doesn't need refills but advises he only has a month's worth of amlodipine and atorvastatin.  No other concerns.  No recent weight or bp taken.   No travel outside the [place] or [place] in the past 3 weeks.

## 2018-09-30 NOTE — Telephone Encounter (Signed)
appnt scheduled

## 2018-10-24 ENCOUNTER — Encounter: Payer: BLUE CROSS/BLUE SHIELD | Admitting: Family Medicine

## 2018-12-01 ENCOUNTER — Ambulatory Visit (INDEPENDENT_AMBULATORY_CARE_PROVIDER_SITE_OTHER): Payer: BC Managed Care – PPO | Admitting: Family Medicine

## 2018-12-01 ENCOUNTER — Other Ambulatory Visit: Payer: Self-pay

## 2018-12-01 ENCOUNTER — Encounter: Payer: Self-pay | Admitting: Family Medicine

## 2018-12-01 VITALS — BP 142/82 | HR 92 | Temp 99.2°F | Ht 74.0 in | Wt 208.0 lb

## 2018-12-01 DIAGNOSIS — R109 Unspecified abdominal pain: Secondary | ICD-10-CM | POA: Diagnosis not present

## 2018-12-01 DIAGNOSIS — K5909 Other constipation: Secondary | ICD-10-CM | POA: Diagnosis not present

## 2018-12-01 DIAGNOSIS — I10 Essential (primary) hypertension: Secondary | ICD-10-CM | POA: Diagnosis not present

## 2018-12-01 DIAGNOSIS — Z01818 Encounter for other preprocedural examination: Secondary | ICD-10-CM | POA: Diagnosis not present

## 2018-12-01 LAB — POCT URINALYSIS DIP (MANUAL ENTRY)
Bilirubin, UA: NEGATIVE
Blood, UA: NEGATIVE
Glucose, UA: NEGATIVE mg/dL
Ketones, POC UA: NEGATIVE mg/dL
Leukocytes, UA: NEGATIVE
Nitrite, UA: NEGATIVE
Protein Ur, POC: NEGATIVE mg/dL
Spec Grav, UA: 1.025 (ref 1.010–1.025)
Urobilinogen, UA: 1 E.U./dL
pH, UA: 6 (ref 5.0–8.0)

## 2018-12-01 NOTE — Patient Instructions (Addendum)
Three times a week one serving of metamucil before breakfast and wash it down with a full glass of water Decrease your miralax use to once a week. It is okay if you only have a bowel movement every 3 days. If it is has been more than 3 days take the miralax. Walk daily to get rid of bloating.     If you have lab work done today you will be contacted with your lab results within the next 2 weeks.  If you have not heard from [place] then please contact us. The fastest way to get your results is to register for My Chart.   IF you received an x-ray today, you will receive an invoice from [facility]. Please contact [facility] at [phone] with questions or concerns regarding your invoice.   IF you received labwork today, you will receive an invoice from [facility]. Please contact [facility] at [phone] with questions or concerns regarding your invoice.   Our billing staff will not be able to assist you with questions regarding bills from these companies.  You will be contacted with the lab results as soon as they are available. The fastest way to get your results is to activate your My Chart account. Instructions are located on the last page of this paperwork. If you have not heard from [place] regarding the results in 2 weeks, please contact this office.       Constipation, Adult Constipation is when a person has fewer bowel movements in a week than normal, has difficulty having a bowel movement, or has stools that are dry, hard, or larger than normal. Constipation may be caused by an underlying condition. It may become worse with age if a person takes certain medicines and does not take in enough fluids. Follow these instructions at home: Eating and drinking   Eat foods that have a lot of fiber, such as fresh fruits and vegetables, whole grains, and beans.  Limit foods that are high in fat, low in fiber, or overly processed, such as french fries, hamburgers, cookies, candies,  and soda.  Drink enough fluid to keep your urine clear or pale yellow. General instructions  Exercise regularly or as told by your health care provider.  Go to the restroom when you have the urge to go. Do not hold it in.  Take over-the-counter and prescription medicines only as told by your health care provider. These include any fiber supplements.  Practice pelvic floor retraining exercises, such as deep breathing while relaxing the lower abdomen and pelvic floor relaxation during bowel movements.  Watch your condition for any changes.  Keep all follow-up visits as told by your health care provider. This is important. Contact a health care provider if:  You have pain that gets worse.  You have a fever.  You do not have a bowel movement after 4 days.  You vomit.  You are not hungry.  You lose weight.  You are bleeding from the anus.  You have thin, pencil-like stools. Get help right away if:  You have a fever and your symptoms suddenly get worse.  You leak stool or have blood in your stool.  Your abdomen is bloated.  You have severe pain in your abdomen.  You feel dizzy or you faint. This information is not intended to replace advice given to you by your health care provider. Make sure you discuss any questions you have with your health care provider. Document Released: 01/06/2004 Document Revised: 03/22/2017 Document Reviewed: 09/28/2015  Elsevier Patient Education  [employer].

## 2018-12-01 NOTE — Progress Notes (Signed)
Established Patient Office Visit  Subjective:  Patient ID: Randy [name], male    DOB: 10-30-1960  Age: 58 y.o. MRN: [id number]  CC:  Chief Complaint  Patient presents with  . Constipation    marlax helped- stomach bloating   . right side and abdominal pain    off and on 2 weeks and more consitant the last couple of days     HPI LASALLE ABEE presents for    Constipation and Laxative Use Onset: 3 weeks ago Patient reports that he has bloating and a feeling of a lot of gas on his stomach He also has pain in the umbilicus and a pain in the right flank He states that these pains come and go  This pain in the abdomen starts before a bowel movement He denies nausea or vomiting  He states that six months ago he has severe stomach pain and would try to have a BM and started with constipation and ended with diarrhea.  He is taking miralax for constipation which is helping him but he continues to have stomach bloating.   Wt Readings from Last 3 Encounters:  12/01/18 208 lb (94.3 kg)  08/15/18 206 lb (93.4 kg)  06/21/18 213 lb (96.6 kg)    Hypertension: Patient here for follow-up of elevated blood pressure. He is exercising and is adherent to low salt diet.  Blood pressure is well controlled at home. Cardiac symptoms none. Patient denies chest pain, claudication, exertional chest pressure/discomfort, irregular heart beat and lower extremity edema.  Cardiovascular risk factors: hypertension and male gender. Use of agents associated with hypertension: none. History of target organ damage: none. BP Readings from Last 3 Encounters:  12/01/18 (!) 142/82  06/21/18 (!) 142/78  08/23/17 (!) 150/72     Past Medical History:  Diagnosis Date  . Hyperlipidemia     Past Surgical History:  Procedure Laterality Date  . APPENDECTOMY      Family History  Problem Relation Age of Onset  . Diabetes Mother   . Hypertension Father   . Kidney disease Father     Social History    Socioeconomic History  . Marital status: Married    Spouse name: Not on file  . Number of children: Not on file  . Years of education: Not on file  . Highest education level: Not on file  Occupational History  . Not on file  Social Needs  . Financial resource strain: Not on file  . Food insecurity    Worry: Not on file    Inability: Not on file  . Transportation needs    Medical: Not on file    Non-medical: Not on file  Tobacco Use  . Smoking status: Never Smoker  . Smokeless tobacco: Never Used  Substance and Sexual Activity  . Alcohol use: No    Alcohol/week: 0.0 standard drinks  . Drug use: No  . Sexual activity: Not on file  Lifestyle  . Physical activity    Days per week: Not on file    Minutes per session: Not on file  . Stress: Not on file  Relationships  . Social Herbalist on phone: Not on file    Gets together: Not on file    Attends religious service: Not on file    Active member of club or organization: Not on file    Attends meetings of clubs or organizations: Not on file    Relationship status: Not on file  .  Intimate partner violence    Fear of current or ex partner: Not on file    Emotionally abused: Not on file    Physically abused: Not on file    Forced sexual activity: Not on file  Other Topics Concern  . Not on file  Social History Narrative  . Not on file    Outpatient Medications Prior to Visit  Medication Sig Dispense Refill  . amLODipine (NORVASC) 10 MG tablet Take 1 tablet (10 mg total) by mouth daily. 90 tablet 1  . atorvastatin (LIPITOR) 20 MG tablet Take 1 tablet (20 mg total) by mouth daily. 90 tablet 3  . fluticasone (FLONASE) 50 MCG/ACT nasal spray Place 2 sprays into both nostrils daily. 16 g 6  . Multiple Vitamin (MULTIVITAMIN) tablet Take 1 tablet by mouth daily.     No facility-administered medications prior to visit.     No Known Allergies  ROS Review of Systems Review of Systems  Constitutional: Negative  for activity change, appetite change, chills and fever.  HENT: Negative for congestion, nosebleeds, trouble swallowing and voice change.   Respiratory: Negative for cough, shortness of breath and wheezing.   Gastrointestinal: Negative for diarrhea, nausea and vomiting.  Genitourinary: Negative for difficulty urinating, dysuria, flank pain and hematuria.  Musculoskeletal: Negative for back pain, joint swelling and neck pain.  Neurological: Negative for dizziness, speech difficulty, light-headedness and numbness.  See HPI. All other review of systems negative.     Objective:    Physical Exam  BP (!) 142/82   Pulse 92   Temp 99.2 F (37.3 C) (Oral)   Ht 6\' 2"  (1.88 m)   Wt 208 lb (94.3 kg)   SpO2 97%   BMI 26.71 kg/m  Wt Readings from Last 3 Encounters:  12/01/18 208 lb (94.3 kg)  08/15/18 206 lb (93.4 kg)  06/21/18 213 lb (96.6 kg)   Physical Exam  Constitutional: Oriented to person, place, and time. Appears well-developed and well-nourished.  HENT:  Head: Normocephalic and atraumatic.  Eyes: Conjunctivae and EOM are normal.  Cardiovascular: Normal rate, regular rhythm, normal heart sounds and intact distal pulses.  No murmur heard. Pulmonary/Chest: Effort normal and breath sounds normal. No stridor. No respiratory distress. Has no wheezes.  Abdomen: non-tender, normoactive bs, non-tender to palpation, no flank tenderness Neurological: Is alert and oriented to person, place, and time.  Skin: Skin is warm. Capillary refill takes less than 2 seconds.  Psychiatric: Has a normal mood and affect. Behavior is normal. Judgment and thought content normal.    Health Maintenance Due  Topic Date Due  . INFLUENZA VACCINE  11/22/2018    There are no preventive care reminders to display for this patient.  Lab Results  Component Value Date   TSH 0.457 06/27/2017   Lab Results  Component Value Date   WBC 4.4 06/27/2017   HGB 15.1 06/27/2017   HCT 43.7 06/27/2017   MCV 88  06/27/2017   PLT 317 06/27/2017   Lab Results  Component Value Date   NA 137 08/22/2018   K 4.4 08/22/2018   CO2 20 08/22/2018   GLUCOSE 104 (H) 08/22/2018   BUN 11 08/22/2018   CREATININE 1.09 08/22/2018   BILITOT 0.5 08/22/2018   ALKPHOS 111 08/22/2018   AST 17 08/22/2018   ALT 18 08/22/2018   PROT 7.6 08/22/2018   ALBUMIN 4.5 08/22/2018   CALCIUM 9.9 08/22/2018   ANIONGAP 14 05/31/2011   Lab Results  Component Value Date   CHOL 151  08/22/2018   Lab Results  Component Value Date   HDL 38 (L) 08/22/2018   Lab Results  Component Value Date   LDLCALC 93 08/22/2018   Lab Results  Component Value Date   TRIG 101 08/22/2018   Lab Results  Component Value Date   CHOLHDL 4.0 08/22/2018   Lab Results  Component Value Date   HGBA1C 5.6 08/22/2018      Assessment & Plan:   Problem List Items Addressed This Visit      Cardiovascular and Mediastinum   Essential hypertension -  Patient's blood pressure is at goal of 139/89 or less. Condition is stable. Continue current medications and treatment plan. I recommend that you exercise for 30-45 minutes 5 days a week. I also recommend a balanced diet with fruits and vegetables every day, lean meats, and little fried foods. The DASH diet (you can find this online) is a good example of this.     Other Visit Diagnoses    Other constipation    -  Primary Discussed dietary changes such as elimination of cheese and bread Discussed adding walking, metamucil and increasing water Stop the miralax at this point he is having diarrhea then constipation cycles Referral placed for colonoscopy and constipation evaluation   Relevant Orders   Ambulatory referral to Gastroenterology   Right flank pain    - will assess for UTI   Relevant Orders   POCT urinalysis dipstick      No orders of the defined types were placed in this encounter.   Follow-up: No follow-ups on file.    Forrest Moron, MD

## 2018-12-08 ENCOUNTER — Encounter: Payer: Self-pay | Admitting: Gastroenterology

## 2018-12-17 ENCOUNTER — Other Ambulatory Visit: Payer: Self-pay

## 2018-12-17 ENCOUNTER — Encounter: Payer: Self-pay | Admitting: Gastroenterology

## 2018-12-17 ENCOUNTER — Ambulatory Visit: Payer: BC Managed Care – PPO | Admitting: Gastroenterology

## 2018-12-17 VITALS — BP 150/74 | HR 96 | Temp 98.9°F | Ht 73.0 in | Wt 205.2 lb

## 2018-12-17 DIAGNOSIS — K5900 Constipation, unspecified: Secondary | ICD-10-CM | POA: Diagnosis not present

## 2018-12-17 DIAGNOSIS — R140 Abdominal distension (gaseous): Secondary | ICD-10-CM | POA: Diagnosis not present

## 2018-12-17 DIAGNOSIS — Z1211 Encounter for screening for malignant neoplasm of colon: Secondary | ICD-10-CM

## 2018-12-17 DIAGNOSIS — R1030 Lower abdominal pain, unspecified: Secondary | ICD-10-CM

## 2018-12-17 MED ORDER — NA SULFATE-K SULFATE-MG SULF 17.5-3.13-1.6 GM/177ML PO SOLN: 1.0000 | ORAL | 0 refills | Status: DC

## 2018-12-17 NOTE — Patient Instructions (Signed)
You have been scheduled for an endoscopy and colonoscopy. Please follow the written instructions given to you at your visit today. Please pick up your prep supplies at the pharmacy within the next 1-3 days. If you use inhalers (even only as needed), please bring them with you on the day of your procedure.  Use Gas-x or Beano over the counter medication for gas as needed.

## 2018-12-17 NOTE — Progress Notes (Signed)
Chief Complaint: Constipation, abdminal pain, bloating  Referring Provider:     Forrest Moron, MD   HPI:    Randy [name] is a 58 y.o. male referred to the Gastroenterology Clinic for evaluation of constipation and right-sided abdominal pain.  Symptoms started in 10/2018, describing abdominal bloating, increased gas, right side/flank pain.  Does describe constipation dating back approximately 6 months or so, described as pellet-like stools and straining.  Has tried MiraLAX with improvement, but continued abdominal bloating.  Did have diarrhea with MiraLAX, so he stopped.    Was seen by his PCM for this issue on 12/01/2018 with plan for dietary modification (decreased dairy, bread), increase activity/exercise, Metamucil, prune juice, increase water intake, and referred to GI.  Now he states he has abdominal discomfort in the b/l lower abdomen. Can radiate to b/l flanks. Still with increased "growling" and bloating throughout the day.  Constipation has resolved with high fiber diet. No fever, chills, n/v, CP, SOB.   Normal CMP in 08/2018.  No recent imaging for review.  No previous EGD or colonoscopy.   Patient is otherwise without preceding exposures to include recent antibiotics, hospitalization, sick contacts, travel and denies new medications, supplements, OTCs.  No known family history of CRC, GI malignancy, liver disease, pancreatic disease, or IBD.    Past Medical History:  Diagnosis Date  . HTN (hypertension)   . Hyperlipidemia   . Kidney stones   . Leaky heart valve    small     Past Surgical History:  Procedure Laterality Date  . APPENDECTOMY    . ORIF ZYGOMATIC FRACTURE Right    Family History  Problem Relation Age of Onset  . Diabetes Mother   . Hypertension Father   . Kidney disease Father    Social History   Tobacco Use  . Smoking status: Never Smoker  . Smokeless tobacco: Never Used  Substance Use Topics  . Alcohol use: No   Alcohol/week: 0.0 standard drinks  . Drug use: No   Current Outpatient Medications  Medication Sig Dispense Refill  . amLODipine (NORVASC) 10 MG tablet Take 1 tablet (10 mg total) by mouth daily. 90 tablet 1  . atorvastatin (LIPITOR) 20 MG tablet Take 1 tablet (20 mg total) by mouth daily. 90 tablet 3  . fluticasone (FLONASE) 50 MCG/ACT nasal spray Place 2 sprays into both nostrils daily. 16 g 6  . Multiple Vitamin (MULTIVITAMIN) tablet Take 1 tablet by mouth daily.     No current facility-administered medications for this visit.    No Known Allergies   Review of Systems: All systems reviewed and negative except where noted in HPI.     Physical Exam:    Wt Readings from Last 3 Encounters:  12/17/18 205 lb 4 Randy (93.1 kg)  12/01/18 208 lb (94.3 kg)  08/15/18 206 lb (93.4 kg)    BP (!) 150/74 (BP Location: Left Arm, Patient Position: Sitting, Cuff Size: Normal)   Pulse 96   Temp 98.9 F (37.2 C)   Ht 6\' 1"  (1.854 m) Comment: height measured without shoes  Wt 205 lb 4 Randy (93.1 kg)   BMI 27.08 kg/m  Constitutional:  Pleasant, in no acute distress. Psychiatric: Normal mood and affect. Behavior is normal. EENT: Pupils normal.  Conjunctivae are normal. No scleral icterus. Neck supple. No cervical LAD. Cardiovascular: Normal rate, regular rhythm. No edema Pulmonary/chest: Effort normal and breath sounds normal. No wheezing, rales  or rhonchi. Abdominal: Soft, nondistended, nontender. Bowel sounds active throughout. There are no masses palpable. No hepatomegaly. Neurological: Alert and oriented to person place and time. Skin: Skin is warm and dry. No rashes noted.   ASSESSMENT AND PLAN;   Randy [name] is a 58 y.o. male presenting with:  1) Constipation 2) Lower abdominal pain 3) Bloating 4) Need for CRC screening  - Gas-X or Beano prn bloating/gas - EGD with duodenal biopsies - Colonoscopy to evaluate for mucosal/luminal etiology for presenting symptoms along with  routine CRC screening - He elected to hold off on trial of Symax or Levsin for now - If no etiology on eval and ongoing sxs, can consider cross sectional imaging - Resume high fiber diet and plenty of fluids  The indications, risks, and benefits of EGD and colonoscopy were explained to the patient in detail. Risks include but are not limited to bleeding, perforation, adverse reaction to medications, and cardiopulmonary compromise. Sequelae include but are not limited to the possibility of surgery, hositalization, and mortality. The patient verbalized understanding and wished to proceed. All questions answered, referred to scheduler and bowel prep ordered. Further recommendations pending results of the exam.      Lavena Bullion, DO, FACG  12/17/2018, 3:41 PM   Forrest Moron, MD

## 2018-12-26 ENCOUNTER — Encounter: Payer: Self-pay | Admitting: Gastroenterology

## 2019-01-05 ENCOUNTER — Encounter: Payer: Self-pay | Admitting: Gastroenterology

## 2019-01-05 ENCOUNTER — Ambulatory Visit (AMBULATORY_SURGERY_CENTER): Payer: BC Managed Care – PPO | Admitting: Gastroenterology

## 2019-01-05 ENCOUNTER — Other Ambulatory Visit: Payer: Self-pay

## 2019-01-05 VITALS — BP 141/85 | HR 75 | Temp 98.9°F | Resp 13 | Ht 73.0 in | Wt 205.0 lb

## 2019-01-05 DIAGNOSIS — K2970 Gastritis, unspecified, without bleeding: Secondary | ICD-10-CM | POA: Diagnosis not present

## 2019-01-05 DIAGNOSIS — K3189 Other diseases of stomach and duodenum: Secondary | ICD-10-CM | POA: Diagnosis not present

## 2019-01-05 DIAGNOSIS — K621 Rectal polyp: Secondary | ICD-10-CM | POA: Diagnosis not present

## 2019-01-05 DIAGNOSIS — R140 Abdominal distension (gaseous): Secondary | ICD-10-CM

## 2019-01-05 DIAGNOSIS — Z1211 Encounter for screening for malignant neoplasm of colon: Secondary | ICD-10-CM

## 2019-01-05 DIAGNOSIS — R1030 Lower abdominal pain, unspecified: Secondary | ICD-10-CM | POA: Diagnosis not present

## 2019-01-05 DIAGNOSIS — D128 Benign neoplasm of rectum: Secondary | ICD-10-CM

## 2019-01-05 DIAGNOSIS — D120 Benign neoplasm of cecum: Secondary | ICD-10-CM | POA: Diagnosis not present

## 2019-01-05 DIAGNOSIS — R1084 Generalized abdominal pain: Secondary | ICD-10-CM

## 2019-01-05 DIAGNOSIS — K5900 Constipation, unspecified: Secondary | ICD-10-CM | POA: Diagnosis not present

## 2019-01-05 DIAGNOSIS — K2990 Gastroduodenitis, unspecified, without bleeding: Secondary | ICD-10-CM

## 2019-01-05 MED ORDER — SODIUM CHLORIDE 0.9 % IV SOLN: 500.0000 mL | Freq: Once | INTRAVENOUS | Status: DC

## 2019-01-05 NOTE — Patient Instructions (Addendum)
YOU HAD AN ENDOSCOPIC PROCEDURE TODAY AT THE [facility]:   Refer to the procedure report that was given to you for any specific questions about what was found during the examination.  If the procedure report does not answer your questions, please call your gastroenterologist to clarify.  If you requested that your care partner not be given the details of your procedure findings, then the procedure report has been included in a sealed envelope for you to review at your convenience later.  YOU SHOULD EXPECT: Some feelings of bloating in the abdomen. Passage of more gas than usual.  Walking can help get rid of the air that was put into your GI tract during the procedure and reduce the bloating. If you had a lower endoscopy (such as a colonoscopy or flexible sigmoidoscopy) you may notice spotting of blood in your stool or on the toilet paper. If you underwent a bowel prep for your procedure, you may not have a normal bowel movement for a few days.  Please Note:  You might notice some irritation and congestion in your nose or some drainage.  This is from the oxygen used during your procedure.  There is no need for concern and it should clear up in a day or so.  SYMPTOMS TO REPORT IMMEDIATELY:   Following lower endoscopy (colonoscopy or flexible sigmoidoscopy):  Excessive amounts of blood in the stool  Significant tenderness or worsening of abdominal pains  Swelling of the abdomen that is new, acute  Fever of 100F or higher   Following upper endoscopy (EGD)  Vomiting of blood or coffee ground material  New chest pain or pain under the shoulder blades  Painful or persistently difficult swallowing  New shortness of breath  Fever of 100F or higher  Black, tarry-looking stools  For urgent or emergent issues, a gastroenterologist can be reached at any hour by calling [phone].   DIET:  We do recommend a small meal at first, but then you may proceed to your regular diet.  Drink  plenty of fluids but you should avoid alcoholic beverages for 24 hours.  ACTIVITY:  You should plan to take it easy for the rest of today and you should NOT DRIVE or use heavy machinery until tomorrow (because of the sedation medicines used during the test).    FOLLOW UP: Our staff will call the number listed on your records 48-72 hours following your procedure to check on you and address any questions or concerns that you may have regarding the information given to you following your procedure. If we do not reach you, we will leave a message.  We will attempt to reach you two times.  During this call, we will ask if you have developed any symptoms of COVID 19. If you develop any symptoms (ie: fever, flu-like symptoms, shortness of breath, cough etc.) before then, please call [phone].  If you test positive for Covid 19 in the 2 weeks post procedure, please call and report this information to us.    If any biopsies were taken you will be contacted by phone or by letter within the next 1-3 weeks.  Please call us at [phone] if you have not heard about the biopsies in 3 weeks.    SIGNATURES/CONFIDENTIALITY: You and/or your care partner have signed paperwork which will be entered into your electronic medical record.  These signatures attest to the fact that that the information above on your After Visit Summary has been reviewed and is   understood.  Full responsibility of the confidentiality of this discharge information lies with you and/or your care-partner.     Handouts were given to you on polyps and gastritis. Use fiber, for example Citrucel, Fibercon, Konsyl or Metamucil.  These are over the counter fiber supplement. You may resume your current medications today. Await biopsy results. Please call if any questions or concerns.

## 2019-01-05 NOTE — Op Note (Signed)
[place] Patient Name: Randy [name] Procedure Date: 01/05/2019 3:01 PM MRN: [id number] Endoscopist: Gerrit Heck , MD Age: 58 Referring MD:  Date of Birth: 03/21/1961 Gender: Male Account #: [id number] Procedure:                Colonoscopy Indications:              Screening for colorectal malignant neoplasm, This                            is the patient's first colonoscopy. He additionally                            endorses changes in bowel habits and increased                            flatus. Medicines:                Monitored Anesthesia Care Procedure:                Pre-Anesthesia Assessment:                           - Prior to the procedure, a History and Physical                            was performed, and patient medications and                            allergies were reviewed. The patient's tolerance of                            previous anesthesia was also reviewed. The risks                            and benefits of the procedure and the sedation                            options and risks were discussed with the patient.                            All questions were answered, and informed consent                            was obtained. Prior Anticoagulants: The patient has                            taken no previous anticoagulant or antiplatelet                            agents. ASA Grade Assessment: II - A patient with                            mild systemic disease. After reviewing the risks  and benefits, the patient was deemed in                            satisfactory condition to undergo the procedure.                           After obtaining informed consent, the colonoscope                            was passed under direct vision. Throughout the                            procedure, the patient's blood pressure, pulse, and                            oxygen saturations were monitored continuously. The                           Colonoscope was introduced through the anus and                            advanced to the the terminal ileum. The colonoscopy                            was performed without difficulty. The patient                            tolerated the procedure well. The quality of the                            bowel preparation was adequate. The terminal ileum,                            ileocecal valve, appendiceal orifice, and rectum                            were photographed. Scope In: 3:09:47 PM Scope Out: 3:30:57 PM Scope Withdrawal Time: 0 hours 18 minutes 14 seconds  Total Procedure Duration: 0 hours 21 minutes 10 seconds  Findings:                 The perianal and digital rectal examinations were                            normal.                           A 8 mm polyp was found in the cecum. The polyp was                            sessile. The polyp was removed with a cold snare.                            Resection and retrieval were complete. Estimated  blood loss was minimal.                           Six sessile polyps were found in the rectum. The                            polyps were 1 to 2 mm in size. These polyps were                            removed with a cold biopsy forceps. Resection and                            retrieval were complete. Estimated blood loss was                            minimal.                           The retroflexed view of the distal rectum and anal                            verge was normal and showed no anal or rectal                            abnormalities.                           The terminal ileum appeared normal. Complications:            No immediate complications. Estimated Blood Loss:     Estimated blood loss was minimal. Impression:               - One 8 mm polyp in the cecum, removed with a cold                            snare. Resected and retrieved.                           -  Six 1 to 2 mm polyps in the rectum, removed with                            a cold biopsy forceps. Resected and retrieved.                           - The distal rectum and anal verge are normal on                            retroflexion view.                           - The examined portion of the ileum was normal. Recommendation:           - Patient has a contact number available for  emergencies. The signs and symptoms of potential                            delayed complications were discussed with the                            patient. Return to normal activities tomorrow.                            Written discharge instructions were provided to the                            patient.                           - Resume previous diet.                           - Continue present medications.                           - Await pathology results.                           - Repeat colonoscopy for surveillance based on                            pathology results.                           - Return to GI clinic PRN.                           - Use fiber, for example Citrucel, Fibercon, Konsyl                            or Metamucil. Gerrit Heck, MD 01/05/2019 3:40:30 PM

## 2019-01-05 NOTE — Progress Notes (Signed)
Temp taken by JB VS taken by CW 

## 2019-01-05 NOTE — Progress Notes (Signed)
No problems noted in the recovery room. maw 

## 2019-01-05 NOTE — Progress Notes (Signed)
Called to room to assist during endoscopic procedure.  Patient ID and intended procedure confirmed with present staff. Received instructions for my participation in the procedure from the performing physician.  

## 2019-01-05 NOTE — Op Note (Signed)
[facility] Patient Name: Rawley Lesnick Procedure Date: 01/05/2019 3:01 PM MRN: [id number] Endoscopist: Gerrit Heck , MD Age: 58 Referring MD:  Date of Birth: 15-Dec-1960 Gender: Male Account #: [id number] Procedure:                Upper GI endoscopy Indications:              Generalized abdominal pain, Abdominal bloating Medicines:                Monitored Anesthesia Care Procedure:                Pre-Anesthesia Assessment:                           - Prior to the procedure, a History and Physical                            was performed, and patient medications and                            allergies were reviewed. The patient's tolerance of                            previous anesthesia was also reviewed. The risks                            and benefits of the procedure and the sedation                            options and risks were discussed with the patient.                            All questions were answered, and informed consent                            was obtained. Prior Anticoagulants: The patient has                            taken no previous anticoagulant or antiplatelet                            agents. ASA Grade Assessment: II - A patient with                            mild systemic disease. After reviewing the risks                            and benefits, the patient was deemed in                            satisfactory condition to undergo the procedure.                           After obtaining informed consent, the endoscope was  passed under direct vision. Throughout the                            procedure, the patient's blood pressure, pulse, and                            oxygen saturations were monitored continuously. The                            Endoscope was introduced through the mouth, and                            advanced to the second part of duodenum. The upper                            GI  endoscopy was accomplished without difficulty.                            The patient tolerated the procedure well. Scope In: Scope Out: Findings:                 The examined esophagus was normal.                           Localized mild inflammation characterized by                            erythema was found in the gastric body. Biopsies                            were taken with a cold forceps for Helicobacter                            pylori testing. Estimated blood loss was minimal.                           The cardia, gastric fundus, incisura and gastric                            antrum were normal.                           The duodenal bulb, first portion of the duodenum                            and second portion of the duodenum were normal.                            Biopsies for histology were taken with a cold                            forceps for evaluation of celiac disease. Estimated                            blood  loss was minimal. Complications:            No immediate complications. Estimated Blood Loss:     Estimated blood loss was minimal. Impression:               - Normal esophagus.                           - Gastritis. Biopsied.                           - Normal cardia, gastric fundus, incisura and                            antrum.                           - Normal duodenal bulb, first portion of the                            duodenum and second portion of the duodenum.                            Biopsied. Recommendation:           - Patient has a contact number available for                            emergencies. The signs and symptoms of potential                            delayed complications were discussed with the                            patient. Return to normal activities tomorrow.                            Written discharge instructions were provided to the                            patient.                           - Resume  previous diet.                           - Continue present medications.                           - Await pathology results.                           - Colonoscopy today. Gerrit Heck, MD 01/05/2019 3:35:58 PM

## 2019-01-05 NOTE — Progress Notes (Signed)
Report to PACU, RN, vss, BBS= Clear.  

## 2019-01-07 ENCOUNTER — Telehealth: Payer: Self-pay

## 2019-01-07 NOTE — Telephone Encounter (Signed)
  Follow up Call-  Call back number 01/05/2019  Post procedure Call Back phone  # [phone]  Permission to leave phone message Yes  Some recent data might be hidden     Patient questions:  Do you have a fever, pain , or abdominal swelling? No. Pain Score  0 *  Have you tolerated food without any problems? Yes.    Have you been able to return to your normal activities? Yes.    Do you have any questions about your discharge instructions: Diet   No. Medications  No. Follow up visit  No.  Do you have questions or concerns about your Care? No.  Actions: * If pain score is 4 or above: No action needed, pain <4.   1. Have you developed a fever since your procedure? no  2.   Have you had an respiratory symptoms (SOB or cough) since your procedure? no  3.   Have you tested positive for COVID 19 since your procedure no  4.   Have you had any family members/close contacts diagnosed with the COVID 19 since your procedure?  no   If yes to any of these questions please route to Joylene John, RN and Alphonsa Gin, Therapist, sports.

## 2019-01-07 NOTE — Telephone Encounter (Signed)
entered in error second call. maw

## 2019-01-12 ENCOUNTER — Encounter: Payer: Self-pay | Admitting: Gastroenterology

## 2019-03-31 ENCOUNTER — Other Ambulatory Visit: Payer: Self-pay | Admitting: Family Medicine

## 2019-03-31 DIAGNOSIS — I10 Essential (primary) hypertension: Secondary | ICD-10-CM

## 2019-03-31 NOTE — Telephone Encounter (Signed)
Requested Prescriptions  Pending Prescriptions Disp Refills  . amLODipine (NORVASC) 10 MG tablet [Pharmacy Med Name: AMLODIPINE BESYLATE 10 MG TAB] 90 tablet 1    Sig: TAKE 1 TABLET BY MOUTH EVERY DAY     Cardiovascular:  Calcium Channel Blockers Failed - 03/31/2019  3:39 PM      Failed - Last BP in normal range    BP Readings from Last 1 Encounters:  01/05/19 (!) 141/85         Passed - Valid encounter within last 6 months    Recent Outpatient Visits          4 months ago Other constipation   Primary Care at [facility], Arlie Solomons, MD   7 months ago Mixed hyperlipidemia   Primary Care at [facility], Arlie Solomons, MD   7 months ago Essential hypertension   Primary Care at [facility], Arlie Solomons, MD   7 months ago Essential hypertension   Primary Care at [facility], Arlie Solomons, MD   9 months ago Sinus congestion   Primary Care at [facility], Arlie Solomons, MD

## 2019-10-08 ENCOUNTER — Telehealth: Payer: Self-pay | Admitting: Family Medicine

## 2019-10-08 ENCOUNTER — Other Ambulatory Visit: Payer: Self-pay | Admitting: Emergency Medicine

## 2019-10-08 DIAGNOSIS — I10 Essential (primary) hypertension: Secondary | ICD-10-CM

## 2019-10-08 MED ORDER — AMLODIPINE BESYLATE 10 MG PO TABS: 10.0000 mg | ORAL_TABLET | Freq: Every day | ORAL | 0 refills | Status: DC

## 2019-10-08 NOTE — Telephone Encounter (Signed)
Rx has been sent to the pharmacy

## 2019-10-08 NOTE — Telephone Encounter (Signed)
Pt wife called and is needing this medication sent in for a courtesy refill. Pt has a med refill appt 10/29/19.  amLODipine (NORVASC) 10 MG tablet [[id number]]   [id number] [place], [place] - [address]

## 2019-10-29 ENCOUNTER — Encounter: Payer: Self-pay | Admitting: Family Medicine

## 2019-10-29 ENCOUNTER — Ambulatory Visit: Payer: BC Managed Care – PPO | Admitting: Family Medicine

## 2019-10-29 ENCOUNTER — Other Ambulatory Visit: Payer: Self-pay

## 2019-10-29 VITALS — BP 138/82 | HR 89 | Temp 98.6°F | Ht 73.0 in | Wt 208.4 lb

## 2019-10-29 DIAGNOSIS — R0981 Nasal congestion: Secondary | ICD-10-CM | POA: Insufficient documentation

## 2019-10-29 DIAGNOSIS — K5909 Other constipation: Secondary | ICD-10-CM | POA: Diagnosis not present

## 2019-10-29 DIAGNOSIS — I10 Essential (primary) hypertension: Secondary | ICD-10-CM

## 2019-10-29 DIAGNOSIS — E782 Mixed hyperlipidemia: Secondary | ICD-10-CM

## 2019-10-29 MED ORDER — POLYETHYLENE GLYCOL 3350 17 GM/SCOOP PO POWD: 17.0000 g | Freq: Two times a day (BID) | ORAL | 5 refills | Status: AC | PRN

## 2019-10-29 MED ORDER — ATORVASTATIN CALCIUM 20 MG PO TABS: 20.0000 mg | ORAL_TABLET | Freq: Every day | ORAL | 3 refills | Status: DC

## 2019-10-29 MED ORDER — FLUTICASONE PROPIONATE 50 MCG/ACT NA SUSP: 2.0000 | Freq: Every day | NASAL | 6 refills | Status: AC

## 2019-10-29 MED ORDER — AMLODIPINE BESYLATE 10 MG PO TABS: 10.0000 mg | ORAL_TABLET | Freq: Every day | ORAL | 3 refills | Status: DC

## 2019-10-29 NOTE — Patient Instructions (Addendum)
For constipation   Make sure you are drinking enough water daily. Make sure you are getting enough fiber in your diet - this will make you regular - you can eat high fiber foods or use metamucil as a supplement - it is really important to drink enough water when using fiber supplements.  If your stools are hard or are formed balls or you have to strain a stool softener will help - use colace 2-3 capsule a day  For gentle treatment of constipation Use Miralax 1-2 capfuls a day until your stools are soft and regular and then decrease the usage - you can use this daily  For more aggressive treatment of constipation Use 4 capfuls of Colace and 6 doses of Miralax and drink it in 2 hours - this should result in several watery stools - if it does not repeat the next day and then go to daily miralax for a week to make sure your bowels are clean and retrained to work properly  For the most aggressive treatment of constipation Use 14 capfuls of Miralax in 1 gallon of fluid (gatoraid or water work well or a combination of the two) and drink over 12h - it is ok to eat during this time and then use Miralax 1 capful daily for about 2 weeks to prevent the constipation from returning   Fiber Content in Foods  See the following list for the dietary fiber content of some common foods. High-fiber foods High-fiber foods contain 4 grams or more (4g or more) of fiber per serving. They include:  Artichoke (fresh) -- 1 medium has 10.3g of fiber.  Baked beans, plain or vegetarian (canned) --  cup has 5.2g of fiber.  Blackberries or raspberries (fresh) --  cup has 4g of fiber.  Bran cereal --  cup has 8.6g of fiber.  Bulgur (cooked) --  cup has 4g of fiber.  Kidney beans (canned) --  cup has 6.8g of fiber.  Lentils (cooked) --  cup has 7.8g of fiber.  Pear (fresh) -- 1 medium has 5.1g of fiber.  Peas (frozen) --  cup has 4.4g of fiber.  Pinto beans (canned) --  cup has 5.5g of  fiber.  Pinto beans (dried and cooked) --  cup has 7.7g of fiber.  Potato with skin (baked) -- 1 medium has 4.4g of fiber.  Quinoa (cooked) --  cup has 5g of fiber.  Soybeans (canned, frozen, or fresh) --  cup has 5.1g of fiber. Moderate-fiber foods Moderate-fiber foods contain 1-4 grams (1-4g) of fiber per serving. They include:  Almonds -- 1 oz. has 3.5g of fiber.  Apple with skin -- 1 medium has 3.3g of fiber.  Applesauce, sweetened --  cup has 1.5g of fiber.  Bagel, plain -- one 4-inch (10-cm) bagel has 2g of fiber.  Banana -- 1 medium has 3.1g of fiber.  Broccoli (cooked) --  cup has 2.5g of fiber.  Carrots (cooked) --  cup has 2.3g of fiber.  Corn (canned or frozen) --  cup has 2.1g of fiber.  Corn tortilla -- one 6-inch (15-cm) tortilla has 1.5g of fiber.  Green beans (canned) --  cup has 2g of fiber.  Instant oatmeal --  cup has about 2g of fiber.  Long-grain brown rice (cooked) -- 1 cup has 3.5g of fiber.  Macaroni, enriched (cooked) -- 1 cup has 2.5g of fiber.  Melon -- 1 cup has 1.4g of fiber.  Multigrain cereal --  cup has about 2-4g of fiber.  Orange -- 1 small has 3.1g of fiber.  Potatoes, mashed --  cup has 1.6g of fiber.  Raisins -- 1/4 cup has 1.6g of fiber.  Squash --  cup has 2.9g of fiber.  Sunflower seeds --  cup has 1.1g of fiber.  Tomato -- 1 medium has 1.5g of fiber.  Vegetable or soy patty -- 1 has 3.4g of fiber.  Whole-wheat bread -- 1 slice has 2g of fiber.  Whole-wheat spaghetti --  cup has 3.2g of fiber. Low-fiber foods Low-fiber foods contain less than 1 gram (less than 1g) of fiber per serving. They include:  Egg -- 1 large.  Flour tortilla -- one 6-inch (15-cm) tortilla.  Fruit juice --  cup.  Lettuce -- 1 cup.  Meat, poultry, or fish -- 1 oz.  Milk -- 1 cup.  Spinach (raw) -- 1 cup.  White bread -- 1 slice.  White rice --  cup.  Yogurt --  cup. Actual amounts of fiber in foods may be  different depending on processing. Talk with your dietitian about how much fiber you need in your diet. This information is not intended to replace advice given to you by your health care provider. Make sure you discuss any questions you have with your health care provider. Document Revised: 12/01/2015 Document Reviewed: 06/02/2015 Elsevier Patient Education  [employer].    If you have lab work done today you will be contacted with your lab results within the next 2 weeks.  If you have not heard from [place] then please contact us. The fastest way to get your results is to register for My Chart.   IF you received an x-ray today, you will receive an invoice from [facility]. Please contact [facility] at [phone] with questions or concerns regarding your invoice.   IF you received labwork today, you will receive an invoice from [facility]. Please contact [facility] at [phone] with questions or concerns regarding your invoice.   Our billing staff will not be able to assist you with questions regarding bills from these companies.  You will be contacted with the lab results as soon as they are available. The fastest way to get your results is to activate your My Chart account. Instructions are located on the last page of this paperwork. If you have not heard from [place] regarding the results in 2 weeks, please contact this office.

## 2019-10-29 NOTE — Progress Notes (Signed)
7/8/202110:33 AM  Randy [name] 24-Jan-1961, 59 y.o., male [id number]  Chief Complaint  Patient presents with  . Hypertension  . Hyperlipidemia    HPI:   Patient is a 59 y.o. male with past medical history significant for HTN, HLP, chronic constipation, gastritis, colonic polyp who presents today for routine followup  Previous PCP Dr Nolon Rod, last OV aug 2020  Overall doing well He has no acute concerns today Takes amlodipine and atorvastatin daily wo issues Has chronic constipation, had EGD/colonoscopy in 2020, gastritis and tubular adenoma, repeat colonoscopy in 7 years Reports BM about once a week Rare abd pain with nausea, vomiting and diarrhea Denies any black tarry stools or bright red blood Does not take anything for this Reports low water and fiber intake  Lab Results  Component Value Date   HGBA1C 5.6 08/22/2018   Lab Results  Component Value Date   LDLCALC 93 08/22/2018   CREATININE 1.09 08/22/2018    Depression screen [facility] 2/9 12/01/2018 09/02/2018 08/15/2018  Decreased Interest 0 0 0  Down, Depressed, Hopeless 0 0 0  PHQ - 2 Score 0 0 0    Fall Risk  10/29/2019 12/01/2018 09/02/2018 08/15/2018 06/21/2018  Falls in the past year? 0 0 0 0 0  Number falls in past yr: 0 0 0 - -  Injury with Fall? 0 0 0 - 0  Follow up - Falls evaluation completed Falls evaluation completed - -     No Known Allergies  Prior to Admission medications   Medication Sig Start Date End Date Taking? Authorizing Provider  amLODipine (NORVASC) 10 MG tablet Take 1 tablet (10 mg total) by mouth daily. 10/08/19  Yes Rutherford Guys, MD  atorvastatin (LIPITOR) 20 MG tablet Take 1 tablet (20 mg total) by mouth daily. Patient taking differently: Take by mouth daily.  09/02/18  Yes Stallings, Zoe A, MD  fluticasone (FLONASE) 50 MCG/ACT nasal spray Place 2 sprays into both nostrils daily. 06/21/18  Yes Forrest Moron, MD  Multiple Vitamin (MULTIVITAMIN) tablet Take 1 tablet by mouth daily.    Yes [provider]  metoprolol tartrate (LOPRESSOR) 100 MG tablet Take 100 mg by mouth 2 (two) times daily. 09/23/19   [provider]    Past Medical History:  Diagnosis Date  . HTN (hypertension)   . Hyperlipidemia   . Kidney stones   . Leaky heart valve    small    Past Surgical History:  Procedure Laterality Date  . APPENDECTOMY    . ORIF ZYGOMATIC FRACTURE Right     Social History   Tobacco Use  . Smoking status: Never Smoker  . Smokeless tobacco: Never Used  Substance Use Topics  . Alcohol use: No    Alcohol/week: 0.0 standard drinks    Family History  Problem Relation Age of Onset  . Diabetes Mother   . Hypertension Father   . Kidney disease Father   . Colon cancer Neg Hx   . Stomach cancer Neg Hx   . Rectal cancer Neg Hx   . Esophageal cancer Neg Hx     Review of Systems  Constitutional: Negative for chills and fever.  Respiratory: Negative for cough and shortness of breath.   Cardiovascular: Negative for chest pain, palpitations and leg swelling.  Gastrointestinal: Negative for abdominal pain, nausea and vomiting.     OBJECTIVE:  Today's Vitals   10/29/19 1011  BP: 138/82  Pulse: 89  Temp: 98.6 F (37 C)  SpO2: 100%  Weight: 208 lb 6.4 oz (94.5 kg)  Height: 6' 1"  (1.854 m)   Body mass index is 27.5 kg/m.   Physical Exam Vitals and nursing note reviewed.  Constitutional:      Appearance: He is well-developed.  HENT:     Head: Normocephalic and atraumatic.  Eyes:     Conjunctiva/sclera: Conjunctivae normal.     Pupils: Pupils are equal, round, and reactive to light.  Cardiovascular:     Rate and Rhythm: Normal rate and regular rhythm.     Heart sounds: No murmur heard.  No friction rub. No gallop.   Pulmonary:     Effort: Pulmonary effort is normal.     Breath sounds: Normal breath sounds. No wheezing or rales.  Musculoskeletal:     Cervical back: Neck supple.     Right lower leg: No edema.     Left lower  leg: No edema.  Skin:    General: Skin is warm and dry.  Neurological:     Mental Status: He is alert and oriented to person, place, and time.     No results found for this or any previous visit (from the past 24 hour(s)).  No results found.   ASSESSMENT and PLAN  1. Essential hypertension Controlled. Continue current regime.  - amLODipine (NORVASC) 10 MG tablet; Take 1 tablet (10 mg total) by mouth daily.  2. Mixed hyperlipidemia Checking labs today, medications will be adjusted as needed.  - Lipid panel - CMP14+EGFR  3. Sinus congestion - fluticasone (FLONASE) 50 MCG/ACT nasal spray; Place 2 sprays into both nostrils daily.  4. Chronic constipation Discussed supportive measures, new meds r/se/b and RTC precautions. Patient educational handout given. Consider linzess, etc  Other orders - atorvastatin (LIPITOR) 20 MG tablet; Take 1 tablet (20 mg total) by mouth daily. - polyethylene glycol powder (GLYCOLAX/MIRALAX) 17 GM/SCOOP powder; Take 17 g by mouth 2 (two) times daily as needed for moderate constipation.  Return in about 6 months (around 04/30/2020).    Rutherford Guys, MD Primary Care at [place], [place] [address] Ph.  [phone] Fax [phone]

## 2019-10-30 LAB — CMP14+EGFR
ALT: 22 IU/L (ref 0–44)
AST: 23 IU/L (ref 0–40)
Albumin/Globulin Ratio: 1.5 (ref 1.2–2.2)
Albumin: 4.6 g/dL (ref 3.8–4.9)
Alkaline Phosphatase: 116 IU/L (ref 48–121)
BUN/Creatinine Ratio: 10 (ref 9–20)
BUN: 11 mg/dL (ref 6–24)
Bilirubin Total: 0.5 mg/dL (ref 0.0–1.2)
CO2: 23 mmol/L (ref 20–29)
Calcium: 9.9 mg/dL (ref 8.7–10.2)
Chloride: 103 mmol/L (ref 96–106)
Creatinine, Ser: 1.07 mg/dL (ref 0.76–1.27)
GFR calc Af Amer: 87 mL/min/{1.73_m2} (ref >59)
GFR calc non Af Amer: 76 mL/min/{1.73_m2} (ref >59)
Globulin, Total: 3 g/dL (ref 1.5–4.5)
Glucose: 101 mg/dL — ABNORMAL HIGH (ref 65–99)
Potassium: 4.5 mmol/L (ref 3.5–5.2)
Sodium: 139 mmol/L (ref 134–144)
Total Protein: 7.6 g/dL (ref 6.0–8.5)

## 2019-10-30 LAB — LIPID PANEL
Chol/HDL Ratio: 3.6 ratio (ref 0.0–5.0)
Cholesterol, Total: 158 mg/dL (ref 100–199)
HDL: 44 mg/dL (ref >39)
LDL Chol Calc (NIH): 98 mg/dL (ref 0–99)
Triglycerides: 82 mg/dL (ref 0–149)
VLDL Cholesterol Cal: 16 mg/dL (ref 5–40)

## 2019-11-19 ENCOUNTER — Encounter: Payer: Self-pay | Admitting: Radiology

## 2020-09-25 ENCOUNTER — Ambulatory Visit
Admission: EM | Admit: 2020-09-25 | Discharge: 2020-09-25 | Disposition: A | Discharge status: Home / Self Care | Payer: BC Managed Care – PPO | Attending: Family Medicine | Admitting: Family Medicine

## 2020-09-25 ENCOUNTER — Other Ambulatory Visit: Payer: Self-pay

## 2020-09-25 DIAGNOSIS — L509 Urticaria, unspecified: Secondary | ICD-10-CM | POA: Diagnosis not present

## 2020-09-25 MED ORDER — LEVOCETIRIZINE DIHYDROCHLORIDE 5 MG PO TABS: 5.0000 mg | ORAL_TABLET | Freq: Every evening | ORAL | 0 refills | Status: AC

## 2020-09-25 MED ORDER — DEXAMETHASONE SODIUM PHOSPHATE 10 MG/ML IJ SOLN
10.0000 mg | Freq: Once | INTRAMUSCULAR | Status: AC
  Administered 2020-09-25: 10 mg via INTRAMUSCULAR

## 2020-09-25 NOTE — ED Provider Notes (Signed)
[facility]    CSN: [id number] Arrival date & time: 09/25/20  0959      History   Chief Complaint Chief Complaint  Patient presents with  . Urticaria    HPI Randy [name] is a 60 y.o. male.   HPI  Patient was stung by a bee 3 days ago and since that time has had generalized itching and hive like eruption. Unaware if he is allergic to bees. He has taken benadryl without relief. Denies any sensation of throat closing or inability to swallow or difficulty breathing.  Past Medical History:  Diagnosis Date  . HTN (hypertension)   . Hyperlipidemia   . Kidney stones   . Leaky heart valve    small    Patient Active Problem List   Diagnosis Date Noted  . Mixed hyperlipidemia 10/29/2019  . Sinus congestion 10/29/2019  . Chronic constipation 10/29/2019  . Essential hypertension 02/11/2015    Past Surgical History:  Procedure Laterality Date  . APPENDECTOMY    . ORIF ZYGOMATIC FRACTURE Right        Home Medications    Prior to Admission medications   Medication Sig Start Date End Date Taking? Authorizing Provider  amLODipine (NORVASC) 10 MG tablet Take 1 tablet (10 mg total) by mouth daily. 10/29/19   Jacelyn Pi, Lilia Argue, MD  atorvastatin (LIPITOR) 20 MG tablet Take 1 tablet (20 mg total) by mouth daily. 10/29/19   Jacelyn Pi, Lilia Argue, MD  fluticasone [facility]) 50 MCG/ACT nasal spray Place 2 sprays into both nostrils daily. 10/29/19   Daleen Squibb, MD  Multiple Vitamin (MULTIVITAMIN) tablet Take 1 tablet by mouth daily.    [provider]  polyethylene glycol powder (GLYCOLAX/MIRALAX) 17 GM/SCOOP powder Take 17 g by mouth 2 (two) times daily as needed for moderate constipation. 10/29/19   Daleen Squibb, MD    Family History Family History  Problem Relation Age of Onset  . Diabetes Mother   . Hypertension Father   . Kidney disease Father   . Colon cancer Neg Hx   . Stomach cancer Neg Hx   . Rectal cancer Neg Hx   . Esophageal  cancer Neg Hx     Social History Social History   Tobacco Use  . Smoking status: Never Smoker  . Smokeless tobacco: Never Used  Vaping Use  . Vaping Use: Never used  Substance Use Topics  . Alcohol use: No    Alcohol/week: 0.0 standard drinks  . Drug use: No     Allergies   Patient has no known allergies.   Review of Systems Review of Systems Pertinent negatives listed in HPI   Physical Exam Triage Vital Signs ED Triage Vitals [09/25/20 1054]  Enc Vitals Group     BP (S) (!) 175/98     Pulse Rate 82     Resp 16     Temp 98.9 F (37.2 C)     Temp Source Oral     SpO2 97 %     Weight      Height      Head Circumference      Peak Flow      Pain Score      Pain Loc      Pain Edu?      Excl. in [place]?    No data found.  Updated Vital Signs BP (S) (!) 175/98 (BP Location: Right Arm) Comment: Pt states did not take BP med this morning  Pulse 82   Temp 98.9 F (37.2 C) (Oral)   Resp 16   SpO2 97%   Visual Acuity Right Eye Distance:   Left Eye Distance:   Bilateral Distance:    Right Eye Near:   Left Eye Near:    Bilateral Near:     Physical Exam General appearance: alert, well developed, well nourished, cooperative  Head: Normocephalic, without obvious abnormality, atraumatic Respiratory: Respirations even and unlabored, normal respiratory rate Heart: Rate and rhythm normal. No gallop or murmurs noted on exam  Extremities: No gross deformities or visible hives Skin: Skin color, texture, turgor normal. No hives, patient is visibly itchy and scratching Psych: Appropriate mood and affect. Neurologic: GCS 15, normal coordination and normal gait   UC Treatments / Results  Labs (all labs ordered are listed, but only abnormal results are displayed) Labs Reviewed - No data to display  EKG   Radiology No results found.  Procedures Procedures (including critical care time)  Medications Ordered in UC Medications  dexamethasone (DECADRON) injection  10 mg (10 mg Intramuscular Given 09/25/20 1138)    Initial Impression / Assessment and Plan / UC Course  I have reviewed the triage vital signs and the nursing notes.  Pertinent labs & imaging results that were available during my care of the patient were reviewed by me and considered in my medical decision making (see chart for details).     Decadron IM given. Home management with Levocetirizine at bedtime 7 days. Return precautions given. Final Clinical Impressions(s) / UC Diagnoses   Final diagnoses:  Urticaria     Discharge Instructions     Take Levocetirizine at bedtime for at least 7 days. If itching recurs, resume medication for additional 7 days.     ED Prescriptions    Medication Sig Dispense Auth. Provider   levocetirizine (XYZAL) 5 MG tablet Take 1 tablet (5 mg total) by mouth every evening. 21 tablet Scot Jun, FNP     PDMP not reviewed this encounter.   Scot Jun, FNP 09/28/20 1721

## 2020-09-25 NOTE — Discharge Instructions (Addendum)
Take Levocetirizine at bedtime for at least 7 days. If itching recurs, resume medication for additional 7 days.

## 2020-09-25 NOTE — ED Triage Notes (Signed)
Patient presents to Urgent Care with complaints of being stung by bee on Friday. He states since he has broken out in hives and has not resolved. Pt states he had some benadryl yesterday.  No hx of bee allergy. Pt unsure if hives related to poison ivy or a bee sting.  Denies SOB.

## 2020-11-29 ENCOUNTER — Ambulatory Visit
Admission: EM | Admit: 2020-11-29 | Discharge: 2020-11-29 | Disposition: A | Discharge status: Home / Self Care | Payer: BC Managed Care – PPO | Attending: Family Medicine | Admitting: Family Medicine

## 2020-11-29 ENCOUNTER — Encounter: Payer: Self-pay | Admitting: Emergency Medicine

## 2020-11-29 DIAGNOSIS — Z760 Encounter for issue of repeat prescription: Secondary | ICD-10-CM | POA: Diagnosis not present

## 2020-11-29 DIAGNOSIS — I10 Essential (primary) hypertension: Secondary | ICD-10-CM

## 2020-11-29 MED ORDER — ATORVASTATIN CALCIUM 20 MG PO TABS: 20.0000 mg | ORAL_TABLET | Freq: Every day | ORAL | 3 refills | Status: DC

## 2020-11-29 MED ORDER — AMLODIPINE BESYLATE 10 MG PO TABS: 10.0000 mg | ORAL_TABLET | Freq: Every day | ORAL | 3 refills | Status: DC

## 2020-11-29 NOTE — ED Provider Notes (Signed)
[facility]    CSN: [id number] Arrival date & time: 11/29/20  1448      History   Chief Complaint No chief complaint on file.   HPI Randy [name] is a 60 y.o. male.   HPI Patient presents today for medication refill of his hypertensive medication.  He reports he has been out of the last 3 days.  Denies any symptoms of chest pain, shortness of breath, lower extremity swelling or headache or dizziness.  He is without a PCP reports that his PCP office closed.  He is in need to get established with a new primary care doctor.  Denies any other complaints.  Past Medical History:  Diagnosis Date   HTN (hypertension)    Hyperlipidemia    Kidney stones    Leaky heart valve    small    Patient Active Problem List   Diagnosis Date Noted   Mixed hyperlipidemia 10/29/2019   Sinus congestion 10/29/2019   Chronic constipation 10/29/2019   Essential hypertension 02/11/2015    Past Surgical History:  Procedure Laterality Date   APPENDECTOMY     ORIF ZYGOMATIC FRACTURE Right        Home Medications    Prior to Admission medications   Medication Sig Start Date End Date Taking? Authorizing Provider  amLODipine (NORVASC) 10 MG tablet Take 1 tablet (10 mg total) by mouth daily. 11/29/20   Scot Jun, FNP  atorvastatin (LIPITOR) 20 MG tablet Take 1 tablet (20 mg total) by mouth daily. 11/29/20   Scot Jun, FNP  fluticasone (FLONASE) 50 MCG/ACT nasal spray Place 2 sprays into both nostrils daily. 10/29/19   Jacelyn Pi, Lilia Argue, MD  levocetirizine (XYZAL) 5 MG tablet Take 1 tablet (5 mg total) by mouth every evening. 09/25/20   Scot Jun, FNP  Multiple Vitamin (MULTIVITAMIN) tablet Take 1 tablet by mouth daily.    [provider]  polyethylene glycol powder (GLYCOLAX/MIRALAX) 17 GM/SCOOP powder Take 17 g by mouth 2 (two) times daily as needed for moderate constipation. 10/29/19   Daleen Squibb, MD    Family History Family History   Problem Relation Age of Onset   Diabetes Mother    Hypertension Father    Kidney disease Father    Colon cancer Neg Hx    Stomach cancer Neg Hx    Rectal cancer Neg Hx    Esophageal cancer Neg Hx     Social History Social History   Tobacco Use   Smoking status: Never   Smokeless tobacco: Never  Vaping Use   Vaping Use: Never used  Substance Use Topics   Alcohol use: No    Alcohol/week: 0.0 standard drinks   Drug use: No     Allergies   Patient has no known allergies.   Review of Systems Review of Systems Pertinent negatives listed in HPI   Physical Exam Triage Vital Signs ED Triage Vitals  Enc Vitals Group     BP 11/29/20 1655 (!) 194/110     Pulse Rate 11/29/20 1655 78     Resp 11/29/20 1655 16     Temp 11/29/20 1655 99.1 F (37.3 C)     Temp Source 11/29/20 1655 Oral     SpO2 11/29/20 1655 95 %     Weight --      Height --      Head Circumference --      Peak Flow --      Pain Score 11/29/20  1658 0     Pain Loc --      Pain Edu? --      Excl. in [place]? --    No data found.  Updated Vital Signs BP (!) 194/110 (BP Location: Right Arm) Comment: out of meds x 4 days  Pulse 78   Temp 99.1 F (37.3 C) (Oral)   Resp 16   SpO2 95%   Visual Acuity Right Eye Distance:   Left Eye Distance:   Bilateral Distance:    Right Eye Near:   Left Eye Near:    Bilateral Near:     Physical Exam General appearance: Alert, well developed, well nourished, cooperative  Head: Normocephalic, without obvious abnormality, atraumatic Respiratory: Respirations even and unlabored, normal respiratory rate Heart: Rate and rhythm normal. No gallop or murmurs noted on exam  Abdomen: BS +, no distention, no rebound tenderness, or no mass Extremities: No gross deformities Skin: Skin color, texture, turgor normal. No rashes seen  Psych: Appropriate mood and affect. Neurologic: GCS 15, normal coordination, normal gait  UC Treatments / Results  Labs (all labs ordered are  listed, but only abnormal results are displayed) Labs Reviewed - No data to display  EKG   Radiology No results found.  Procedures Procedures (including critical care time)  Medications Ordered in UC Medications - No data to display  Initial Impression / Assessment and Plan / UC Course  I have reviewed the triage vital signs and the nursing notes.  Pertinent labs & imaging results that were available during my care of the patient were reviewed by me and considered in my medical decision making (see chart for details).    Essential hypertension and Medication refill Refill patient's amlodipine and atorvastatin Established patient with a new patient appointment at [facility] primary care scheduled for 01/04/2021 ER precautions given if he develops any red flag symptoms.  Blood pressure rechecked however recheck value was documented in system which was significantly lower than recorded reading Final Clinical Impressions(s) / UC Diagnoses   Final diagnoses:  Essential hypertension  Medication refill   Discharge Instructions   None    ED Prescriptions     Medication Sig Dispense Auth. Provider   amLODipine (NORVASC) 10 MG tablet Take 1 tablet (10 mg total) by mouth daily. 90 tablet Scot Jun, FNP   atorvastatin (LIPITOR) 20 MG tablet Take 1 tablet (20 mg total) by mouth daily. 90 tablet Scot Jun, FNP      PDMP not reviewed this encounter.   Scot Jun, FNP 12/06/20 1032

## 2020-11-29 NOTE — ED Triage Notes (Signed)
Needs refills on bp and cholesterol medications

## 2020-12-11 ENCOUNTER — Encounter: Payer: Self-pay | Admitting: Emergency Medicine

## 2020-12-11 ENCOUNTER — Ambulatory Visit
Admission: EM | Admit: 2020-12-11 | Discharge: 2020-12-11 | Disposition: A | Discharge status: Home / Self Care | Payer: BC Managed Care – PPO | Attending: Physician Assistant | Admitting: Physician Assistant

## 2020-12-11 ENCOUNTER — Other Ambulatory Visit: Payer: Self-pay

## 2020-12-11 DIAGNOSIS — J069 Acute upper respiratory infection, unspecified: Secondary | ICD-10-CM | POA: Diagnosis not present

## 2020-12-11 NOTE — ED Provider Notes (Addendum)
[facility]    CSN: [id number] Arrival date & time: 12/11/20  0804      History   Chief Complaint No chief complaint on file.   HPI Randy [name] is a 59 y.o. male.   Pt complains cough, congestion, post nasal drip that started Friday.  Denies fever, chills, shortness of breath, chest pain, n/v/d.  He has been taking tylenol with some improvement.  He tried Flonase with some relief, only used twice. He had his COVID vaccine and booster.     He was seen here about two weeks ago for blood pressure, restarted on amlodipine at that time.  He has an upcoming PCP appointment on 01/04/21.  He did take his medication this morning. He has been taking Sudafed.    Past Medical History:  Diagnosis Date   HTN (hypertension)    Hyperlipidemia    Kidney stones    Leaky heart valve    small    Patient Active Problem List   Diagnosis Date Noted   Mixed hyperlipidemia 10/29/2019   Sinus congestion 10/29/2019   Chronic constipation 10/29/2019   Essential hypertension 02/11/2015    Past Surgical History:  Procedure Laterality Date   APPENDECTOMY     ORIF ZYGOMATIC FRACTURE Right        Home Medications    Prior to Admission medications   Medication Sig Start Date End Date Taking? Authorizing Provider  amLODipine (NORVASC) 10 MG tablet Take 1 tablet (10 mg total) by mouth daily. 11/29/20   Scot Jun, FNP  atorvastatin (LIPITOR) 20 MG tablet Take 1 tablet (20 mg total) by mouth daily. 11/29/20   Scot Jun, FNP  fluticasone (FLONASE) 50 MCG/ACT nasal spray Place 2 sprays into both nostrils daily. 10/29/19   Jacelyn Pi, Lilia Argue, MD  levocetirizine (XYZAL) 5 MG tablet Take 1 tablet (5 mg total) by mouth every evening. 09/25/20   Scot Jun, FNP  Multiple Vitamin (MULTIVITAMIN) tablet Take 1 tablet by mouth daily.    [provider]  polyethylene glycol powder (GLYCOLAX/MIRALAX) 17 GM/SCOOP powder Take 17 g by mouth 2 (two) times daily as  needed for moderate constipation. 10/29/19   Daleen Squibb, MD    Family History Family History  Problem Relation Age of Onset   Diabetes Mother    Hypertension Father    Kidney disease Father    Colon cancer Neg Hx    Stomach cancer Neg Hx    Rectal cancer Neg Hx    Esophageal cancer Neg Hx     Social History Social History   Tobacco Use   Smoking status: Never   Smokeless tobacco: Never  Vaping Use   Vaping Use: Never used  Substance Use Topics   Alcohol use: No    Alcohol/week: 0.0 standard drinks   Drug use: No     Allergies   Patient has no known allergies.   Review of Systems Review of Systems  Constitutional:  Negative for chills and fever.  HENT:  Positive for postnasal drip. Negative for ear pain and sore throat.   Eyes:  Negative for pain and visual disturbance.  Respiratory:  Positive for cough. Negative for shortness of breath and wheezing.   Cardiovascular:  Negative for chest pain and palpitations.  Gastrointestinal:  Negative for abdominal pain and vomiting.  Genitourinary:  Negative for dysuria and hematuria.  Musculoskeletal:  Negative for arthralgias and back pain.  Skin:  Negative for color change and rash.  Neurological:  Negative for seizures and syncope.  All other systems reviewed and are negative.   Physical Exam Triage Vital Signs ED Triage Vitals  Enc Vitals Group     BP 12/11/20 0812 (!) 174/99     Pulse Rate 12/11/20 0812 (!) 102     Resp 12/11/20 0812 16     Temp 12/11/20 0812 99.7 F (37.6 C)     Temp Source 12/11/20 0812 Temporal     SpO2 12/11/20 0812 95 %     Weight --      Height --      Head Circumference --      Peak Flow --      Pain Score 12/11/20 0813 6     Pain Loc --      Pain Edu? --      Excl. in [place]? --    No data found.  Updated Vital Signs BP (!) 174/99   Pulse (!) 102   Temp 99.7 F (37.6 C) (Temporal)   Resp 16   SpO2 95%   Visual Acuity Right Eye Distance:   Left Eye Distance:    Bilateral Distance:    Right Eye Near:   Left Eye Near:    Bilateral Near:     Physical Exam Vitals and nursing note reviewed.  Constitutional:      Appearance: He is well-developed.  HENT:     Head: Normocephalic and atraumatic.  Eyes:     Conjunctiva/sclera: Conjunctivae normal.  Cardiovascular:     Rate and Rhythm: Normal rate and regular rhythm.     Heart sounds: No murmur heard. Pulmonary:     Effort: Pulmonary effort is normal. No respiratory distress.     Breath sounds: Normal breath sounds.  Abdominal:     Palpations: Abdomen is soft.     Tenderness: There is no abdominal tenderness.  Musculoskeletal:     Cervical back: Neck supple.  Skin:    General: Skin is warm and dry.  Neurological:     Mental Status: He is alert.     UC Treatments / Results  Labs (all labs ordered are listed, but only abnormal results are displayed) Labs Reviewed - No data to display  EKG   Radiology No results found.  Procedures Procedures (including critical care time)  Medications Ordered in UC Medications - No data to display  Initial Impression / Assessment and Plan / UC Course  I have reviewed the triage vital signs and the nursing notes.  Pertinent labs & imaging results that were available during my care of the patient were reviewed by me and considered in my medical decision making (see chart for details).     URI, COVID test pending.  Pt well appearing, no acute distress, lungs clear. Recommend Flonase, Delsym, fluids, mucinex.  Advised to avoid decongestants due to HTN.  Keep follow up with PCP, return precautions discussed.  Final Clinical Impressions(s) / UC Diagnoses   Final diagnoses:  None   Discharge Instructions   None    ED Prescriptions   None    PDMP not reviewed this encounter.   Ward, Lenise Arena, PA-C 12/11/20 [place], Lenise Arena, PA-C 12/11/20 [phone]

## 2020-12-11 NOTE — Discharge Instructions (Addendum)
Recommend Flonase daily, Mucinex as needed, and Delsym as needed for cough.  Drink plenty of fluids Can take Ibuprofen as needed for headache.  COVID test pending. If positive self isolate for 5 days and then wear a mask around others for 5 days.   Keep appointment with primary care regarding hypertension.

## 2020-12-11 NOTE — ED Triage Notes (Signed)
Itchy eyes chest burning with coughing and states he feels like he has a lot of mucus in his chest since Friday.

## 2020-12-12 LAB — SARS-COV-2, NAA 2 DAY TAT

## 2020-12-12 LAB — NOVEL CORONAVIRUS, NAA: SARS-CoV-2, NAA: DETECTED — AB

## 2021-01-04 ENCOUNTER — Ambulatory Visit: Payer: BC Managed Care – PPO | Admitting: Nurse Practitioner

## 2021-03-10 DIAGNOSIS — Z125 Encounter for screening for malignant neoplasm of prostate: Secondary | ICD-10-CM | POA: Diagnosis not present

## 2021-03-10 DIAGNOSIS — I10 Essential (primary) hypertension: Secondary | ICD-10-CM | POA: Diagnosis not present

## 2021-03-10 DIAGNOSIS — Z13228 Encounter for screening for other metabolic disorders: Secondary | ICD-10-CM | POA: Diagnosis not present

## 2021-03-10 DIAGNOSIS — E785 Hyperlipidemia, unspecified: Secondary | ICD-10-CM | POA: Diagnosis not present

## 2021-03-19 DIAGNOSIS — E785 Hyperlipidemia, unspecified: Secondary | ICD-10-CM | POA: Diagnosis not present

## 2021-03-19 DIAGNOSIS — I10 Essential (primary) hypertension: Secondary | ICD-10-CM | POA: Diagnosis not present

## 2021-03-19 DIAGNOSIS — R7303 Prediabetes: Secondary | ICD-10-CM | POA: Diagnosis not present

## 2022-06-09 DIAGNOSIS — E785 Hyperlipidemia, unspecified: Secondary | ICD-10-CM | POA: Diagnosis not present

## 2022-06-09 DIAGNOSIS — I10 Essential (primary) hypertension: Secondary | ICD-10-CM | POA: Diagnosis not present

## 2022-06-09 DIAGNOSIS — R8290 Unspecified abnormal findings in urine: Secondary | ICD-10-CM | POA: Diagnosis not present

## 2022-12-18 ENCOUNTER — Encounter: Payer: Self-pay | Admitting: Emergency Medicine

## 2022-12-18 ENCOUNTER — Ambulatory Visit: Payer: BC Managed Care – PPO | Admitting: Emergency Medicine

## 2022-12-18 VITALS — BP 146/92 | HR 98 | Temp 98.3°F | Ht 74.0 in | Wt 204.0 lb

## 2022-12-18 DIAGNOSIS — R399 Unspecified symptoms and signs involving the genitourinary system: Secondary | ICD-10-CM | POA: Diagnosis not present

## 2022-12-18 DIAGNOSIS — Z7689 Persons encountering health services in other specified circumstances: Secondary | ICD-10-CM

## 2022-12-18 DIAGNOSIS — E785 Hyperlipidemia, unspecified: Secondary | ICD-10-CM | POA: Diagnosis not present

## 2022-12-18 DIAGNOSIS — Z8679 Personal history of other diseases of the circulatory system: Secondary | ICD-10-CM | POA: Diagnosis not present

## 2022-12-18 DIAGNOSIS — I10 Essential (primary) hypertension: Secondary | ICD-10-CM

## 2022-12-18 DIAGNOSIS — Z23 Encounter for immunization: Secondary | ICD-10-CM

## 2022-12-18 LAB — COMPREHENSIVE METABOLIC PANEL
ALT: 31 U/L (ref 0–53)
AST: 24 U/L (ref 0–37)
Albumin: 4.2 g/dL (ref 3.5–5.2)
Alkaline Phosphatase: 115 U/L (ref 39–117)
BUN: 11 mg/dL (ref 6–23)
CO2: 26 mEq/L (ref 19–32)
Calcium: 9.8 mg/dL (ref 8.4–10.5)
Chloride: 104 mEq/L (ref 96–112)
Creatinine, Ser: 0.98 mg/dL (ref 0.40–1.50)
GFR: 82.34 mL/min (ref >60.00)
Glucose, Bld: 107 mg/dL — ABNORMAL HIGH (ref 70–99)
Potassium: 4 mEq/L (ref 3.5–5.1)
Sodium: 139 mEq/L (ref 135–145)
Total Bilirubin: 0.7 mg/dL (ref 0.2–1.2)
Total Protein: 7.7 g/dL (ref 6.0–8.3)

## 2022-12-18 LAB — LIPID PANEL
Cholesterol: 156 mg/dL (ref 0–200)
HDL: 42.3 mg/dL (ref >39.00)
LDL Cholesterol: 94 mg/dL (ref 0–99)
NonHDL: 114.02
Total CHOL/HDL Ratio: 4
Triglycerides: 98 mg/dL (ref 0.0–149.0)
VLDL: 19.6 mg/dL (ref 0.0–40.0)

## 2022-12-18 LAB — URINALYSIS
Bilirubin Urine: NEGATIVE
Hgb urine dipstick: NEGATIVE
Ketones, ur: NEGATIVE
Leukocytes,Ua: NEGATIVE
Nitrite: NEGATIVE
Specific Gravity, Urine: 1.02 (ref 1.000–1.030)
Total Protein, Urine: NEGATIVE
Urine Glucose: NEGATIVE
Urobilinogen, UA: 1 (ref 0.0–1.0)
pH: 6 (ref 5.0–8.0)

## 2022-12-18 LAB — CBC WITH DIFFERENTIAL/PLATELET
Basophils Absolute: 0 10*3/uL (ref 0.0–0.1)
Basophils Relative: 0.5 % (ref 0.0–3.0)
Eosinophils Absolute: 0 10*3/uL (ref 0.0–0.7)
Eosinophils Relative: 0.3 % (ref 0.0–5.0)
HCT: 45.7 % (ref 39.0–52.0)
Hemoglobin: 15.1 g/dL (ref 13.0–17.0)
Lymphocytes Relative: 18.2 % (ref 12.0–46.0)
Lymphs Abs: 1 10*3/uL (ref 0.7–4.0)
MCHC: 33.1 g/dL (ref 30.0–36.0)
MCV: 92.4 fl (ref 78.0–100.0)
Monocytes Absolute: 0.5 10*3/uL (ref 0.1–1.0)
Monocytes Relative: 8.4 % (ref 3.0–12.0)
Neutro Abs: 4 10*3/uL (ref 1.4–7.7)
Neutrophils Relative %: 72.6 % (ref 43.0–77.0)
Platelets: 257 10*3/uL (ref 150.0–400.0)
RBC: 4.94 Mil/uL (ref 4.22–5.81)
RDW: 13.9 % (ref 11.5–15.5)
WBC: 5.4 10*3/uL (ref 4.0–10.5)

## 2022-12-18 LAB — PSA: PSA: 0.82 ng/mL (ref 0.10–4.00)

## 2022-12-18 LAB — HEMOGLOBIN A1C: Hgb A1c MFr Bld: 6 % (ref 4.6–6.5)

## 2022-12-18 MED ORDER — ATORVASTATIN CALCIUM 20 MG PO TABS: 20.0000 mg | ORAL_TABLET | Freq: Every day | ORAL | 3 refills | Status: DC

## 2022-12-18 MED ORDER — AMLODIPINE BESYLATE 10 MG PO TABS: 10.0000 mg | ORAL_TABLET | Freq: Every day | ORAL | 3 refills | Status: DC

## 2022-12-18 NOTE — Assessment & Plan Note (Signed)
And possible valvular heart disease Murmur on examination Recommend cardiology evaluation

## 2022-12-18 NOTE — Patient Instructions (Signed)
Hypertension, Adult High blood pressure (hypertension) is when the force of blood pumping through the arteries is too strong. The arteries are the blood vessels that carry blood from the heart throughout the body. Hypertension forces the heart to work harder to pump blood and may cause arteries to become narrow or stiff. Untreated or uncontrolled hypertension can lead to a heart attack, heart failure, a stroke, kidney disease, and other problems. A blood pressure reading consists of a higher number over a lower number. Ideally, your blood pressure should be below 120/80. The first ("top") number is called the systolic pressure. It is a measure of the pressure in your arteries as your heart beats. The second ("bottom") number is called the diastolic pressure. It is a measure of the pressure in your arteries as the heart relaxes. What are the causes? The exact cause of this condition is not known. There are some conditions that result in high blood pressure. What increases the risk? Certain factors may make you more likely to develop high blood pressure. Some of these risk factors are under your control, including: Smoking. Not getting enough exercise or physical activity. Being overweight. Having too much fat, sugar, calories, or salt (sodium) in your diet. Drinking too much alcohol. Other risk factors include: Having a personal history of heart disease, diabetes, high cholesterol, or kidney disease. Stress. Having a family history of high blood pressure and high cholesterol. Having obstructive sleep apnea. Age. The risk increases with age. What are the signs or symptoms? High blood pressure may not cause symptoms. Very high blood pressure (hypertensive crisis) may cause: Headache. Fast or irregular heartbeats (palpitations). Shortness of breath. Nosebleed. Nausea and vomiting. Vision changes. Severe chest pain, dizziness, and seizures. How is this diagnosed? This condition is diagnosed by  measuring your blood pressure while you are seated, with your arm resting on a flat surface, your legs uncrossed, and your feet flat on the floor. The cuff of the blood pressure monitor will be placed directly against the skin of your upper arm at the level of your heart. Blood pressure should be measured at least twice using the same arm. Certain conditions can cause a difference in blood pressure between your right and left arms. If you have a high blood pressure reading during one visit or you have normal blood pressure with other risk factors, you may be asked to: Return on a different day to have your blood pressure checked again. Monitor your blood pressure at home for 1 week or longer. If you are diagnosed with hypertension, you may have other blood or imaging tests to help your health care provider understand your overall risk for other conditions. How is this treated? This condition is treated by making healthy lifestyle changes, such as eating healthy foods, exercising more, and reducing your alcohol intake. You may be referred for counseling on a healthy diet and physical activity. Your health care provider may prescribe medicine if lifestyle changes are not enough to get your blood pressure under control and if: Your systolic blood pressure is above 130. Your diastolic blood pressure is above 80. Your personal target blood pressure may vary depending on your medical conditions, your age, and other factors. Follow these instructions at home: Eating and drinking  Eat a diet that is high in fiber and potassium, and low in sodium, added sugar, and fat. An example of this eating plan is called the DASH diet. DASH stands for Dietary Approaches to Stop Hypertension. To eat this way: Eat   plenty of fresh fruits and vegetables. Try to fill one half of your plate at each meal with fruits and vegetables. Eat whole grains, such as whole-wheat pasta, brown rice, or whole-grain bread. Fill about one  fourth of your plate with whole grains. Eat or drink low-fat dairy products, such as skim milk or low-fat yogurt. Avoid fatty cuts of meat, processed or cured meats, and poultry with skin. Fill about one fourth of your plate with lean proteins, such as fish, chicken without skin, beans, eggs, or tofu. Avoid pre-made and processed foods. These tend to be higher in sodium, added sugar, and fat. Reduce your daily sodium intake. Many people with hypertension should eat less than 1,500 mg of sodium a day. Do not drink alcohol if: Your health care provider tells you not to drink. You are pregnant, may be pregnant, or are planning to become pregnant. If you drink alcohol: Limit how much you have to: 0-1 drink a day for women. 0-2 drinks a day for men. Know how much alcohol is in your drink. In the [place] one drink equals one 12 oz bottle of beer (355 mL), one 5 oz glass of wine (148 mL), or one 1 oz glass of hard liquor (44 mL). Lifestyle  Work with your health care provider to maintain a healthy body weight or to lose weight. Ask what an ideal weight is for you. Get at least 30 minutes of exercise that causes your heart to beat faster (aerobic exercise) most days of the week. Activities may include walking, swimming, or biking. Include exercise to strengthen your muscles (resistance exercise), such as Pilates or lifting weights, as part of your weekly exercise routine. Try to do these types of exercises for 30 minutes at least 3 days a week. Do not use any products that contain nicotine or tobacco. These products include cigarettes, chewing tobacco, and vaping devices, such as e-cigarettes. If you need help quitting, ask your health care provider. Monitor your blood pressure at home as told by your health care provider. Keep all follow-up visits. This is important. Medicines Take over-the-counter and prescription medicines only as told by your health care provider. Follow directions carefully. Blood  pressure medicines must be taken as prescribed. Do not skip doses of blood pressure medicine. Doing this puts you at risk for problems and can make the medicine less effective. Ask your health care provider about side effects or reactions to medicines that you should watch for. Contact a health care provider if you: Think you are having a reaction to a medicine you are taking. Have headaches that keep coming back (recurring). Feel dizzy. Have swelling in your ankles. Have trouble with your vision. Get help right away if you: Develop a severe headache or confusion. Have unusual weakness or numbness. Feel faint. Have severe pain in your chest or abdomen. Vomit repeatedly. Have trouble breathing. These symptoms may be an emergency. Get help right away. Call 911. Do not wait to see if the symptoms will go away. Do not drive yourself to the hospital. Summary Hypertension is when the force of blood pumping through your arteries is too strong. If this condition is not controlled, it may put you at risk for serious complications. Your personal target blood pressure may vary depending on your medical conditions, your age, and other factors. For most people, a normal blood pressure is less than 120/80. Hypertension is treated with lifestyle changes, medicines, or a combination of both. Lifestyle changes include losing weight, eating a healthy,   low-sodium diet, exercising more, and limiting alcohol. This information is not intended to replace advice given to you by your health care provider. Make sure you discuss any questions you have with your health care provider. Document Revised: 02/14/2021 Document Reviewed: 02/14/2021 Elsevier Patient Education  2024 Elsevier Inc.  

## 2022-12-18 NOTE — Assessment & Plan Note (Signed)
Elevated blood pressure reading in the office but normal at home Continue amlodipine 10 mg daily Cardiovascular risks associated with hypertension discussed Dietary approaches to stop hypertension discussed Advised to monitor blood pressure readings at home daily for the next several weeks and keep a log.  Advised to contact the office if numbers persistently abnormal

## 2022-12-18 NOTE — Progress Notes (Signed)
Randy [name] 62 y.o.   Chief Complaint  Patient presents with   New Patient (Initial Visit)    Frequent urination, more at night    HISTORY OF PRESENT ILLNESS: This is a 62 y.o. male first visit to this office, here to establish care with me History of hypertension and dyslipidemia Past medical history of rheumatic fever at age 74 with residual "leaky valve" Has some frequent urination, twice a night No other complaints or medical concerns today. Non-smoker.  Exercises regularly.  Rides a bike. Has some concerns about sexual dysfunction.  Not sure if he wants to try Viagra yet.  HPI   Prior to Admission medications   Medication Sig Start Date End Date Taking? Authorizing Provider  amLODipine (NORVASC) 10 MG tablet Take 1 tablet (10 mg total) by mouth daily. 11/29/20  Yes Bing Neighbors, NP  atorvastatin (LIPITOR) 20 MG tablet Take 1 tablet (20 mg total) by mouth daily. 11/29/20  Yes Bing Neighbors, NP  fluticasone (FLONASE) 50 MCG/ACT nasal spray Place 2 sprays into both nostrils daily. Patient not taking: Reported on 12/18/2022 10/29/19   Lezlie Lye, Meda Coffee, MD  levocetirizine (XYZAL) 5 MG tablet Take 1 tablet (5 mg total) by mouth every evening. Patient not taking: Reported on 12/18/2022 09/25/20   Bing Neighbors, NP  Multiple Vitamin (MULTIVITAMIN) tablet Take 1 tablet by mouth daily. Patient not taking: Reported on 12/18/2022    [provider]  polyethylene glycol powder (GLYCOLAX/MIRALAX) 17 GM/SCOOP powder Take 17 g by mouth 2 (two) times daily as needed for moderate constipation. Patient not taking: Reported on 12/18/2022 10/29/19   Lezlie Lye, Meda Coffee, MD    No Known Allergies  Patient Active Problem List   Diagnosis Date Noted   Mixed hyperlipidemia 10/29/2019   Chronic constipation 10/29/2019   Essential hypertension 02/11/2015    Past Medical History:  Diagnosis Date   HTN (hypertension)    Hyperlipidemia    Kidney stones    Leaky heart  valve    small    Past Surgical History:  Procedure Laterality Date   APPENDECTOMY     ORIF ZYGOMATIC FRACTURE Right     Social History   Socioeconomic History   Marital status: Married    Spouse name: Not on file   Number of children: 1   Years of education: Not on file   Highest education level: Not on file  Occupational History   Not on file  Tobacco Use   Smoking status: Never   Smokeless tobacco: Never  Vaping Use   Vaping status: Never Used  Substance and Sexual Activity   Alcohol use: No    Alcohol/week: 0.0 standard drinks of alcohol   Drug use: No   Sexual activity: Not on file  Other Topics Concern   Not on file  Social History Narrative   Not on file   Social Determinants of Health   Financial Resource Strain: Not on file  Food Insecurity: Not on file  Transportation Needs: Not on file  Physical Activity: Not on file  Stress: Not on file  Social Connections: Not on file  Intimate Partner Violence: Not on file    Family History  Problem Relation Age of Onset   Diabetes Mother    Hypertension Father    Kidney disease Father    Colon cancer Neg Hx    Stomach cancer Neg Hx    Rectal cancer Neg Hx    Esophageal cancer Neg Hx  Review of Systems  Constitutional: Negative.  Negative for chills and fever.  HENT: Negative.  Negative for congestion and sore throat.   Respiratory: Negative.  Negative for cough and shortness of breath.   Cardiovascular: Negative.  Negative for chest pain and palpitations.  Gastrointestinal:  Negative for abdominal pain, diarrhea, nausea and vomiting.  Genitourinary:  Positive for frequency.  Skin: Negative.  Negative for rash.  Neurological: Negative.  Negative for dizziness and headaches.  All other systems reviewed and are negative.   Vitals:   12/18/22 0818  BP: (!) 142/88  Pulse: 98  Temp: 98.3 F (36.8 C)  SpO2: 95%    Physical Exam Vitals reviewed.  Constitutional:      Appearance: Normal  appearance.  HENT:     Head: Normocephalic.     Mouth/Throat:     Mouth: Mucous membranes are moist.     Pharynx: Oropharynx is clear.  Eyes:     Extraocular Movements: Extraocular movements intact.     Conjunctiva/sclera: Conjunctivae normal.     Pupils: Pupils are equal, round, and reactive to light.  Cardiovascular:     Rate and Rhythm: Normal rate and regular rhythm.     Pulses: Normal pulses.     Heart sounds: Murmur (Systolic 2/6 aortic area) heard.  Pulmonary:     Effort: Pulmonary effort is normal.     Breath sounds: Normal breath sounds.  Abdominal:     Palpations: Abdomen is soft.     Tenderness: There is no abdominal tenderness.  Musculoskeletal:     Cervical back: No tenderness.  Lymphadenopathy:     Cervical: No cervical adenopathy.  Skin:    General: Skin is warm and dry.  Neurological:     General: No focal deficit present.     Mental Status: He is alert and oriented to person, place, and time.  Psychiatric:        Mood and Affect: Mood normal.        Behavior: Behavior normal.      ASSESSMENT & PLAN: A total of 48 minutes was spent with the patient and counseling/coordination of care regarding preparing for this visit, review of available medical records, establishing care with me, comprehensive history and physical examination, review of chronic medical conditions under management, review of all medications, need for blood work today, education and nutrition, review of health maintenance items, prognosis, documentation and need for follow-up.  Problem List Items Addressed This Visit       Cardiovascular and Mediastinum   Essential hypertension - Primary    Elevated blood pressure reading in the office but normal at home Continue amlodipine 10 mg daily Cardiovascular risks associated with hypertension discussed Dietary approaches to stop hypertension discussed Advised to monitor blood pressure readings at home daily for the next several weeks and keep a  log.  Advised to contact the office if numbers persistently abnormal      Relevant Medications   atorvastatin (LIPITOR) 20 MG tablet   amLODipine (NORVASC) 10 MG tablet   Other Relevant Orders   CBC with Differential/Platelet   Comprehensive metabolic panel   Hemoglobin A1c   Lipid panel     Other   Dyslipidemia    Chronic stable condition Lipid profile done today Continue atorvastatin 20 mg daily      Relevant Medications   atorvastatin (LIPITOR) 20 MG tablet   History of rheumatic fever    And possible valvular heart disease Murmur on examination Recommend cardiology evaluation  Relevant Orders   Ambulatory referral to Cardiology   Lower urinary tract symptoms    Daytime frequency and nocturia Some sexual dysfunction as well No periods of incontinence PSA today May need urology evaluation      Relevant Orders   PSA   Urinalysis   Other Visit Diagnoses     Encounter to establish care       Need for vaccination       Relevant Orders   Flu vaccine trivalent PF, 6mos and older(Flulaval,Afluria,Fluarix,Fluzone)      Patient Instructions  Hypertension, Adult High blood pressure (hypertension) is when the force of blood pumping through the arteries is too strong. The arteries are the blood vessels that carry blood from the heart throughout the body. Hypertension forces the heart to work harder to pump blood and may cause arteries to become narrow or stiff. Untreated or uncontrolled hypertension can lead to a heart attack, heart failure, a stroke, kidney disease, and other problems. A blood pressure reading consists of a higher number over a lower number. Ideally, your blood pressure should be below 120/80. The first ("top") number is called the systolic pressure. It is a measure of the pressure in your arteries as your heart beats. The second ("bottom") number is called the diastolic pressure. It is a measure of the pressure in your arteries as the heart  relaxes. What are the causes? The exact cause of this condition is not known. There are some conditions that result in high blood pressure. What increases the risk? Certain factors may make you more likely to develop high blood pressure. Some of these risk factors are under your control, including: Smoking. Not getting enough exercise or physical activity. Being overweight. Having too much fat, sugar, calories, or salt (sodium) in your diet. Drinking too much alcohol. Other risk factors include: Having a personal history of heart disease, diabetes, high cholesterol, or kidney disease. Stress. Having a family history of high blood pressure and high cholesterol. Having obstructive sleep apnea. Age. The risk increases with age. What are the signs or symptoms? High blood pressure may not cause symptoms. Very high blood pressure (hypertensive crisis) may cause: Headache. Fast or irregular heartbeats (palpitations). Shortness of breath. Nosebleed. Nausea and vomiting. Vision changes. Severe chest pain, dizziness, and seizures. How is this diagnosed? This condition is diagnosed by measuring your blood pressure while you are seated, with your arm resting on a flat surface, your legs uncrossed, and your feet flat on the floor. The cuff of the blood pressure monitor will be placed directly against the skin of your upper arm at the level of your heart. Blood pressure should be measured at least twice using the same arm. Certain conditions can cause a difference in blood pressure between your right and left arms. If you have a high blood pressure reading during one visit or you have normal blood pressure with other risk factors, you may be asked to: Return on a different day to have your blood pressure checked again. Monitor your blood pressure at home for 1 week or longer. If you are diagnosed with hypertension, you may have other blood or imaging tests to help your health care provider understand  your overall risk for other conditions. How is this treated? This condition is treated by making healthy lifestyle changes, such as eating healthy foods, exercising more, and reducing your alcohol intake. You may be referred for counseling on a healthy diet and physical activity. Your health care provider may prescribe medicine  if lifestyle changes are not enough to get your blood pressure under control and if: Your systolic blood pressure is above 130. Your diastolic blood pressure is above 80. Your personal target blood pressure may vary depending on your medical conditions, your age, and other factors. Follow these instructions at home: Eating and drinking  Eat a diet that is high in fiber and potassium, and low in sodium, added sugar, and fat. An example of this eating plan is called the DASH diet. DASH stands for Dietary Approaches to Stop Hypertension. To eat this way: Eat plenty of fresh fruits and vegetables. Try to fill one half of your plate at each meal with fruits and vegetables. Eat whole grains, such as whole-wheat pasta, brown rice, or whole-grain bread. Fill about one fourth of your plate with whole grains. Eat or drink low-fat dairy products, such as skim milk or low-fat yogurt. Avoid fatty cuts of meat, processed or cured meats, and poultry with skin. Fill about one fourth of your plate with lean proteins, such as fish, chicken without skin, beans, eggs, or tofu. Avoid pre-made and processed foods. These tend to be higher in sodium, added sugar, and fat. Reduce your daily sodium intake. Many people with hypertension should eat less than 1,500 mg of sodium a day. Do not drink alcohol if: Your health care provider tells you not to drink. You are pregnant, may be pregnant, or are planning to become pregnant. If you drink alcohol: Limit how much you have to: 0-1 drink a day for women. 0-2 drinks a day for men. Know how much alcohol is in your drink. In the [place] one drink equals  one 12 oz bottle of beer (355 mL), one 5 oz glass of wine (148 mL), or one 1 oz glass of hard liquor (44 mL). Lifestyle  Work with your health care provider to maintain a healthy body weight or to lose weight. Ask what an ideal weight is for you. Get at least 30 minutes of exercise that causes your heart to beat faster (aerobic exercise) most days of the week. Activities may include walking, swimming, or biking. Include exercise to strengthen your muscles (resistance exercise), such as Pilates or lifting weights, as part of your weekly exercise routine. Try to do these types of exercises for 30 minutes at least 3 days a week. Do not use any products that contain nicotine or tobacco. These products include cigarettes, chewing tobacco, and vaping devices, such as e-cigarettes. If you need help quitting, ask your health care provider. Monitor your blood pressure at home as told by your health care provider. Keep all follow-up visits. This is important. Medicines Take over-the-counter and prescription medicines only as told by your health care provider. Follow directions carefully. Blood pressure medicines must be taken as prescribed. Do not skip doses of blood pressure medicine. Doing this puts you at risk for problems and can make the medicine less effective. Ask your health care provider about side effects or reactions to medicines that you should watch for. Contact a health care provider if you: Think you are having a reaction to a medicine you are taking. Have headaches that keep coming back (recurring). Feel dizzy. Have swelling in your ankles. Have trouble with your vision. Get help right away if you: Develop a severe headache or confusion. Have unusual weakness or numbness. Feel faint. Have severe pain in your chest or abdomen. Vomit repeatedly. Have trouble breathing. These symptoms may be an emergency. Get help right away. Call 911.  Do not wait to see if the symptoms will go  away. Do not drive yourself to the hospital. Summary Hypertension is when the force of blood pumping through your arteries is too strong. If this condition is not controlled, it may put you at risk for serious complications. Your personal target blood pressure may vary depending on your medical conditions, your age, and other factors. For most people, a normal blood pressure is less than 120/80. Hypertension is treated with lifestyle changes, medicines, or a combination of both. Lifestyle changes include losing weight, eating a healthy, low-sodium diet, exercising more, and limiting alcohol. This information is not intended to replace advice given to you by your health care provider. Make sure you discuss any questions you have with your health care provider. Document Revised: 02/14/2021 Document Reviewed: 02/14/2021 Elsevier Patient Education  2024 Elsevier Inc.     Edwina Barth, MD  [facility] at [facility]

## 2022-12-18 NOTE — Assessment & Plan Note (Signed)
Daytime frequency and nocturia Some sexual dysfunction as well No periods of incontinence PSA today May need urology evaluation

## 2022-12-18 NOTE — Assessment & Plan Note (Signed)
Chronic stable condition Lipid profile done today Continue atorvastatin 20 mg daily

## 2023-01-03 ENCOUNTER — Telehealth: Payer: Self-pay | Admitting: Emergency Medicine

## 2023-01-03 NOTE — Telephone Encounter (Signed)
Prescription Request  01/03/2023  LOV: 12/18/2022 Pt stated he never pick up his medication on 8/27 and he is completely out What is the name of the medication or equipment?  amLODipine (NORVASC) 10 MG tablet [   Have you contacted your pharmacy to request a refill? No   Which pharmacy would you like this sent to?  [id number] [place], [place] - [address] [place] [address] Phone: [phone] Fax: [phone]    Patient notified that their request is being sent to the clinical staff for review and that they should receive a response within 2 business days.   Please advise at Mobile [phone] (mobile)

## 2023-01-03 NOTE — Telephone Encounter (Signed)
Pt wife called asking to see how to start the process with getting her husband an hospital bed to help with his breathing. Please call spouse back at [phone]

## 2023-01-07 NOTE — Telephone Encounter (Signed)
Called patient and was unable to leave VM due to mailbox not being set up. Will try again later

## 2023-01-08 NOTE — Telephone Encounter (Signed)
Called and left message again for patient called office in reference to previous message

## 2023-01-08 NOTE — Telephone Encounter (Signed)
Patient returned Don's call and would like a call back at [phone].

## 2023-01-09 NOTE — Telephone Encounter (Signed)
Called patient again unable to leave VM .

## 2023-03-18 ENCOUNTER — Ambulatory Visit: Payer: BC Managed Care – PPO | Attending: Cardiology | Admitting: Cardiology

## 2023-03-18 ENCOUNTER — Encounter: Payer: Self-pay | Admitting: Cardiology

## 2023-03-18 VITALS — BP 164/70 | HR 97 | Ht 74.0 in | Wt 210.0 lb

## 2023-03-18 DIAGNOSIS — I10 Essential (primary) hypertension: Secondary | ICD-10-CM | POA: Diagnosis not present

## 2023-03-18 DIAGNOSIS — Z8679 Personal history of other diseases of the circulatory system: Secondary | ICD-10-CM

## 2023-03-18 DIAGNOSIS — E782 Mixed hyperlipidemia: Secondary | ICD-10-CM | POA: Diagnosis not present

## 2023-03-18 DIAGNOSIS — I061 Rheumatic aortic insufficiency: Secondary | ICD-10-CM

## 2023-03-18 NOTE — Progress Notes (Signed)
Cardiology Office Note:  .   Date:  03/18/2023  ID:  Randy [name], DOB Aug 02, 1960, MRN [id number] PCP: Georgina Quint, MD  [facility] Providers Cardiologist:  Donato Schultz, MD     History of Present Illness: .   Randy [name] is a 62 y.o. male Discussed with the use of AI scribe   History of Present Illness   The patient, a 62 year old male with a history of rheumatic fever at age 39, presents for evaluation of a residual leaky valve. He is currently being treated for hypertension with amlodipine 10mg  daily and hyperlipidemia with atorvastatin 20mg  daily. The patient denies any current symptoms, including chest pain or shortness of breath. He reports a regular exercise routine, riding an exercise bike for 10-12 miles five days a week without any issues.  The patient's last echocardiogram in 2014 showed mild left ventricular hypertrophy with an ejection fraction of 60%, a mildly dilated left atrium, mild aortic regurgitation, a redundant intraatrial septum, and trace tricuspid and pulmonic regurgitation. The patient's primary concern is his blood pressure, which he does not currently monitor at home. He expresses a desire to purchase a blood pressure cuff for home monitoring.  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 He reports no issues related to his work environment that could impact his health. The patient is a non-smoker and does not report any alcohol use.            Studies Reviewed: Marland Kitchen   EKG Interpretation Date/Time:  Monday March 18 2023 09:03:28 EST Ventricular Rate:  97 PR Interval:  196 QRS Duration:  116 QT Interval:  364 QTC Calculation: 462 R Axis:   -47  Text Interpretation: Normal sinus rhythm Left axis deviation Left ventricular hypertrophy with QRS widening ( R in aVL , Cornell product ) When compared with ECG of 20-Oct-1998 21:29, QRS duration has increased  Confirmed by Donato Schultz ([id number]) on 03/18/2023 9:07:57 AM    Results LABS LDL: 94 (12/18/2022) HDL: 42 (12/18/2022) Creatinine: 0.98 Hemoglobin: 15.1 ALT: 31  DIAGNOSTIC Echocardiogram: Concentric mild left ventricular hypertrophy, ejection fraction 60%, mildly dilated left atrium, mild aortic regurgitation, redundant intraatrial septum, trace tricuspid and pulmonic regurgitation (2014) EKG: Normal (03/18/2023)  Risk Assessment/Calculations:           Physical Exam:   VS:  BP (!) 164/70   Pulse 97   Ht 6\' 2"  (1.88 m)   Wt 210 lb (95.3 kg)   SpO2 96%   BMI 26.96 kg/m    Wt Readings from Last 3 Encounters:  03/18/23 210 lb (95.3 kg)  12/18/22 204 lb (92.5 kg)  10/29/19 208 lb 6.4 oz (94.5 kg)    GEN: Well nourished, well developed in no acute distress NECK: No JVD; No carotid bruits CARDIAC: RRR, 1/6 SM, no rubs, no gallops RESPIRATORY:  Clear to auscultation without rales, wheezing or rhonchi  ABDOMEN: Soft, non-tender, non-distended EXTREMITIES:  No edema; No deformity   ASSESSMENT AND PLAN: .    Assessment and Plan    Rheumatic Heart Disease with Aortic Regurgitation Residual mild aortic regurgitation from rheumatic fever at age 55. Last echocardiogram (2014) showed mild aortic regurgitation, concentric mild left ventricular hypertrophy, mildly dilated left atrium, redundant intraatrial septum, and trace tricuspid and pulmonic regurgitation. Currently asymptomatic with good exercise tolerance. Discussed follow-up echocardiogram to monitor condition and explained that mild aortic regurgitation usually does not cause harm. - Order echocardiogram -  Schedule follow-up visit to review echocardiogram results  Atrial Fibrillation Atrial fibrillation, currently asymptomatic. EKG today was normal. Discussed importance of monitoring for new symptoms and maintaining current management. - Continue current management - Monitor for new symptoms  Hypertension Managed with  amlodipine 10 mg daily. Blood pressure slightly elevated in office, likely due to white coat syndrome. No home monitoring currently. Discussed importance of home blood pressure monitoring for accurate readings and avoiding unnecessary medication adjustments. - Recommend purchasing a home blood pressure cuff - Advise home blood pressure monitoring, especially post-exercise - Review blood pressure readings at next visit  Hyperlipidemia Managed with atorvastatin 20 mg daily. Recent labs: LDL 94, HDL 42. - Continue atorvastatin 20 mg daily  General Health Maintenance Nonsmoker, regular exercise, no alcohol consumption. No recent fainting, chest pain, or shortness of breath. Discussed benefits of continued regular exercise and healthy diet for blood pressure and lipid control. - Encourage continued regular exercise - Maintain a healthy diet to support blood pressure and lipid control  Follow-up - Schedule follow-up visit after echocardiogram - Review home blood pressure readings at next visit.               Signed, Donato Schultz, MD

## 2023-03-18 NOTE — Patient Instructions (Signed)
Medication Instructions:  The current medical regimen is effective;  continue present plan and medications.  *If you need a refill on your cardiac medications before your next appointment, please call your pharmacy*   Testing/Procedures: Your physician has requested that you have an echocardiogram. Echocardiography is a painless test that uses sound waves to create images of your heart. It provides your doctor with information about the size and shape of your heart and how well your heart's chambers and valves are working. This procedure takes approximately one hour. There are no restrictions for this procedure. Please do NOT wear cologne, perfume, aftershave, or lotions (deodorant is allowed). Please arrive 15 minutes prior to your appointment time.  Please note: We ask at that you not bring children with you during ultrasound (echo/ vascular) testing. Due to room size and safety concerns, children are not allowed in the ultrasound rooms during exams. Our front office staff cannot provide observation of children in our lobby area while testing is being conducted. An adult accompanying a patient to their appointment will only be allowed in the ultrasound room at the discretion of the ultrasound technician under special circumstances. We apologize for any inconvenience.   Follow-Up: At [facility], you and your health needs are our priority.  As part of our continuing mission to provide you with exceptional heart care, we have created designated Provider Care Teams.  These Care Teams include your primary Cardiologist (physician) and Advanced Practice Providers (APPs -  Physician Assistants and Nurse Practitioners) who all work together to provide you with the care you need, when you need it.  We recommend signing up for the patient portal called "MyChart".  Sign up information is provided on this After Visit Summary.  MyChart is used to connect with patients for Virtual Visits  (Telemedicine).  Patients are able to view lab/test results, encounter notes, upcoming appointments, etc.  Non-urgent messages can be sent to your provider as well.   To learn more about what you can do with MyChart, go to [email or web address].    Your next appointment:   1 year(s)  Provider:   Dr Donato Schultz

## 2023-03-20 ENCOUNTER — Ambulatory Visit: Payer: BC Managed Care – PPO | Admitting: Emergency Medicine

## 2023-03-20 ENCOUNTER — Encounter: Payer: Self-pay | Admitting: Emergency Medicine

## 2023-03-20 VITALS — BP 140/90 | HR 99 | Temp 98.8°F | Ht 74.0 in | Wt 208.2 lb

## 2023-03-20 DIAGNOSIS — I10 Essential (primary) hypertension: Secondary | ICD-10-CM | POA: Diagnosis not present

## 2023-03-20 DIAGNOSIS — R399 Unspecified symptoms and signs involving the genitourinary system: Secondary | ICD-10-CM | POA: Diagnosis not present

## 2023-03-20 DIAGNOSIS — E785 Hyperlipidemia, unspecified: Secondary | ICD-10-CM

## 2023-03-20 NOTE — Assessment & Plan Note (Addendum)
Lower urinary tract symptoms persist. Normal recent PSA level. Advised to decrease amount of daily caffeine and monitor amounts of fluid intake and timing during the day Regarding hypertension treatment, will avoid diuretics.

## 2023-03-20 NOTE — Assessment & Plan Note (Signed)
Chronic stable condition Lab Results  Component Value Date   CHOL 156 12/18/2022   HDL 42.30 12/18/2022   LDLCALC 94 12/18/2022   TRIG 98.0 12/18/2022   CHOLHDL 4 12/18/2022    Continue atorvastatin 20 mg daily

## 2023-03-20 NOTE — Assessment & Plan Note (Signed)
Elevated blood pressure readings in the office and at home. Cardiovascular risks associated with uncontrolled hypertension discussed Dietary approaches to stop hypertension discussed Advised to monitor blood pressure readings at home daily for the next several weeks and keep a log.  Advised to contact the office if numbers persistently abnormal. Recommend to continue amlodipine 10 mg daily and start valsartan 80 mg daily Most recent blood work results from last August reviewed with patient.  Within normal limits. Follow-up in 3 months

## 2023-03-20 NOTE — Patient Instructions (Signed)
Hypertension, Adult High blood pressure (hypertension) is when the force of blood pumping through the arteries is too strong. The arteries are the blood vessels that carry blood from the heart throughout the body. Hypertension forces the heart to work harder to pump blood and may cause arteries to become narrow or stiff. Untreated or uncontrolled hypertension can lead to a heart attack, heart failure, a stroke, kidney disease, and other problems. A blood pressure reading consists of a higher number over a lower number. Ideally, your blood pressure should be below 120/80. The first ("top") number is called the systolic pressure. It is a measure of the pressure in your arteries as your heart beats. The second ("bottom") number is called the diastolic pressure. It is a measure of the pressure in your arteries as the heart relaxes. What are the causes? The exact cause of this condition is not known. There are some conditions that result in high blood pressure. What increases the risk? Certain factors may make you more likely to develop high blood pressure. Some of these risk factors are under your control, including: Smoking. Not getting enough exercise or physical activity. Being overweight. Having too much fat, sugar, calories, or salt (sodium) in your diet. Drinking too much alcohol. Other risk factors include: Having a personal history of heart disease, diabetes, high cholesterol, or kidney disease. Stress. Having a family history of high blood pressure and high cholesterol. Having obstructive sleep apnea. Age. The risk increases with age. What are the signs or symptoms? High blood pressure may not cause symptoms. Very high blood pressure (hypertensive crisis) may cause: Headache. Fast or irregular heartbeats (palpitations). Shortness of breath. Nosebleed. Nausea and vomiting. Vision changes. Severe chest pain, dizziness, and seizures. How is this diagnosed? This condition is diagnosed by  measuring your blood pressure while you are seated, with your arm resting on a flat surface, your legs uncrossed, and your feet flat on the floor. The cuff of the blood pressure monitor will be placed directly against the skin of your upper arm at the level of your heart. Blood pressure should be measured at least twice using the same arm. Certain conditions can cause a difference in blood pressure between your right and left arms. If you have a high blood pressure reading during one visit or you have normal blood pressure with other risk factors, you may be asked to: Return on a different day to have your blood pressure checked again. Monitor your blood pressure at home for 1 week or longer. If you are diagnosed with hypertension, you may have other blood or imaging tests to help your health care provider understand your overall risk for other conditions. How is this treated? This condition is treated by making healthy lifestyle changes, such as eating healthy foods, exercising more, and reducing your alcohol intake. You may be referred for counseling on a healthy diet and physical activity. Your health care provider may prescribe medicine if lifestyle changes are not enough to get your blood pressure under control and if: Your systolic blood pressure is above 130. Your diastolic blood pressure is above 80. Your personal target blood pressure may vary depending on your medical conditions, your age, and other factors. Follow these instructions at home: Eating and drinking  Eat a diet that is high in fiber and potassium, and low in sodium, added sugar, and fat. An example of this eating plan is called the DASH diet. DASH stands for Dietary Approaches to Stop Hypertension. To eat this way: Eat   plenty of fresh fruits and vegetables. Try to fill one half of your plate at each meal with fruits and vegetables. Eat whole grains, such as whole-wheat pasta, brown rice, or whole-grain bread. Fill about one  fourth of your plate with whole grains. Eat or drink low-fat dairy products, such as skim milk or low-fat yogurt. Avoid fatty cuts of meat, processed or cured meats, and poultry with skin. Fill about one fourth of your plate with lean proteins, such as fish, chicken without skin, beans, eggs, or tofu. Avoid pre-made and processed foods. These tend to be higher in sodium, added sugar, and fat. Reduce your daily sodium intake. Many people with hypertension should eat less than 1,500 mg of sodium a day. Do not drink alcohol if: Your health care provider tells you not to drink. You are pregnant, may be pregnant, or are planning to become pregnant. If you drink alcohol: Limit how much you have to: 0-1 drink a day for women. 0-2 drinks a day for men. Know how much alcohol is in your drink. In the [place] one drink equals one 12 oz bottle of beer (355 mL), one 5 oz glass of wine (148 mL), or one 1 oz glass of hard liquor (44 mL). Lifestyle  Work with your health care provider to maintain a healthy body weight or to lose weight. Ask what an ideal weight is for you. Get at least 30 minutes of exercise that causes your heart to beat faster (aerobic exercise) most days of the week. Activities may include walking, swimming, or biking. Include exercise to strengthen your muscles (resistance exercise), such as Pilates or lifting weights, as part of your weekly exercise routine. Try to do these types of exercises for 30 minutes at least 3 days a week. Do not use any products that contain nicotine or tobacco. These products include cigarettes, chewing tobacco, and vaping devices, such as e-cigarettes. If you need help quitting, ask your health care provider. Monitor your blood pressure at home as told by your health care provider. Keep all follow-up visits. This is important. Medicines Take over-the-counter and prescription medicines only as told by your health care provider. Follow directions carefully. Blood  pressure medicines must be taken as prescribed. Do not skip doses of blood pressure medicine. Doing this puts you at risk for problems and can make the medicine less effective. Ask your health care provider about side effects or reactions to medicines that you should watch for. Contact a health care provider if you: Think you are having a reaction to a medicine you are taking. Have headaches that keep coming back (recurring). Feel dizzy. Have swelling in your ankles. Have trouble with your vision. Get help right away if you: Develop a severe headache or confusion. Have unusual weakness or numbness. Feel faint. Have severe pain in your chest or abdomen. Vomit repeatedly. Have trouble breathing. These symptoms may be an emergency. Get help right away. Call 911. Do not wait to see if the symptoms will go away. Do not drive yourself to the hospital. Summary Hypertension is when the force of blood pumping through your arteries is too strong. If this condition is not controlled, it may put you at risk for serious complications. Your personal target blood pressure may vary depending on your medical conditions, your age, and other factors. For most people, a normal blood pressure is less than 120/80. Hypertension is treated with lifestyle changes, medicines, or a combination of both. Lifestyle changes include losing weight, eating a healthy,   low-sodium diet, exercising more, and limiting alcohol. This information is not intended to replace advice given to you by your health care provider. Make sure you discuss any questions you have with your health care provider. Document Revised: 02/14/2021 Document Reviewed: 02/14/2021 Elsevier Patient Education  2024 Elsevier Inc.  

## 2023-03-20 NOTE — Progress Notes (Signed)
Randy [name] 62 y.o.   Chief Complaint  Patient presents with   Medical Management of Chronic Issues    3 month for HTN. Patient states he's been urinating more frequently during the night     HISTORY OF PRESENT ILLNESS: This is a 62 y.o. male here for 57-month follow-up of hypertension Exercises frequently.  Bike riding.  No chest pain or difficulty breathing on exertion Nocturia x 2.  Some daytime urgency.  Related to fluid and caffeine intake. No other complaints or medical concerns today. BP Readings from Last 3 Encounters:  03/20/23 (!) 140/90  03/18/23 (!) 164/70  12/18/22 (!) 146/92   Wt Readings from Last 3 Encounters:  03/20/23 208 lb 3.2 oz (94.4 kg)  03/18/23 210 lb (95.3 kg)  12/18/22 204 lb (92.5 kg)     HPI   Prior to Admission medications   Medication Sig Start Date End Date Taking? Authorizing Provider  amLODipine (NORVASC) 10 MG tablet Take 1 tablet (10 mg total) by mouth daily. 12/18/22  Yes Ahana Najera, Eilleen Kempf, MD  atorvastatin (LIPITOR) 20 MG tablet Take 1 tablet (20 mg total) by mouth daily. 12/18/22  Yes Khalil Belote, Eilleen Kempf, MD  fluticasone [facility]) 50 MCG/ACT nasal spray Place 2 sprays into both nostrils daily. Patient not taking: Reported on 12/18/2022 10/29/19   Lezlie Lye, Meda Coffee, MD  levocetirizine (XYZAL) 5 MG tablet Take 1 tablet (5 mg total) by mouth every evening. Patient not taking: Reported on 12/18/2022 09/25/20   Bing Neighbors, NP  Multiple Vitamin (MULTIVITAMIN) tablet Take 1 tablet by mouth daily. Patient not taking: Reported on 12/18/2022    [provider]  polyethylene glycol powder (GLYCOLAX/MIRALAX) 17 GM/SCOOP powder Take 17 g by mouth 2 (two) times daily as needed for moderate constipation. Patient not taking: Reported on 12/18/2022 10/29/19   Lezlie Lye, Meda Coffee, MD    No Known Allergies  Patient Active Problem List   Diagnosis Date Noted   Dyslipidemia 12/18/2022   History of rheumatic fever 12/18/2022    Lower urinary tract symptoms 12/18/2022   Mixed hyperlipidemia 10/29/2019   Chronic constipation 10/29/2019   Essential hypertension 02/11/2015    Past Medical History:  Diagnosis Date   HTN (hypertension)    Hyperlipidemia    Kidney stones    Leaky heart valve    small    Past Surgical History:  Procedure Laterality Date   APPENDECTOMY     ORIF ZYGOMATIC FRACTURE Right     Social History   Socioeconomic History   Marital status: Married    Spouse name: Not on file   Number of children: 1   Years of education: Not on file   Highest education level: Not on file  Occupational History   Not on file  Tobacco Use   Smoking status: Never   Smokeless tobacco: Never  Vaping Use   Vaping status: Never Used  Substance and Sexual Activity   Alcohol use: No    Alcohol/week: 0.0 standard drinks of alcohol   Drug use: No   Sexual activity: Not on file  Other Topics Concern   Not on file  Social History Narrative   Not on file   Social Determinants of Health   Financial Resource Strain: Not on file  Food Insecurity: Not on file  Transportation Needs: Not on file  Physical Activity: Not on file  Stress: Not on file  Social Connections: Not on file  Intimate Partner Violence: Not on file  Family History  Problem Relation Age of Onset   Diabetes Mother    Hypertension Father    Kidney disease Father    Colon cancer Neg Hx    Stomach cancer Neg Hx    Rectal cancer Neg Hx    Esophageal cancer Neg Hx      Review of Systems  Constitutional: Negative.  Negative for chills and fever.  HENT: Negative.  Negative for congestion and sore throat.   Respiratory: Negative.  Negative for cough and shortness of breath.   Cardiovascular: Negative.  Negative for chest pain and palpitations.  Gastrointestinal:  Negative for abdominal pain, diarrhea, nausea and vomiting.  Genitourinary:  Positive for frequency.  Skin: Negative.  Negative for rash.  Neurological: Negative.   Negative for dizziness and headaches.  All other systems reviewed and are negative.   Vitals:   03/20/23 0825  BP: (!) 140/90  Pulse: 99  Temp: 98.8 F (37.1 C)  SpO2: 98%    Physical Exam Vitals reviewed.  Constitutional:      Appearance: Normal appearance.  HENT:     Head: Normocephalic.  Eyes:     Extraocular Movements: Extraocular movements intact.  Cardiovascular:     Rate and Rhythm: Normal rate and regular rhythm.     Pulses: Normal pulses.     Heart sounds: Normal heart sounds.  Pulmonary:     Effort: Pulmonary effort is normal.     Breath sounds: Normal breath sounds.  Skin:    General: Skin is warm and dry.     Capillary Refill: Capillary refill takes less than 2 seconds.  Neurological:     General: No focal deficit present.     Mental Status: He is alert.  Psychiatric:        Mood and Affect: Mood normal.        Behavior: Behavior normal.      ASSESSMENT & PLAN: A total of 44 minutes was spent with the patient and counseling/coordination of care regarding preparing for this visit, review of most recent office visit notes, review of multiple chronic medical conditions and their management, cardiovascular risks associated with hypertension, review of all medications and changes made, review of most recent bloodwork results, review of health maintenance items, education on nutrition, prognosis, documentation, and need for follow up.   Problem List Items Addressed This Visit       Cardiovascular and Mediastinum   Essential hypertension - Primary    Elevated blood pressure readings in the office and at home. Cardiovascular risks associated with uncontrolled hypertension discussed Dietary approaches to stop hypertension discussed Advised to monitor blood pressure readings at home daily for the next several weeks and keep a log.  Advised to contact the office if numbers persistently abnormal. Recommend to continue amlodipine 10 mg daily and start valsartan 80  mg daily Most recent blood work results from last August reviewed with patient.  Within normal limits. Follow-up in 3 months        Other   Dyslipidemia    Chronic stable condition Lab Results  Component Value Date   CHOL 156 12/18/2022   HDL 42.30 12/18/2022   LDLCALC 94 12/18/2022   TRIG 98.0 12/18/2022   CHOLHDL 4 12/18/2022    Continue atorvastatin 20 mg daily      Lower urinary tract symptoms    Lower urinary tract symptoms persist. Normal recent PSA level. Advised to decrease amount of daily caffeine and monitor amounts of fluid intake and timing during the  day Regarding hypertension treatment, will avoid diuretics.      Patient Instructions  Hypertension, Adult High blood pressure (hypertension) is when the force of blood pumping through the arteries is too strong. The arteries are the blood vessels that carry blood from the heart throughout the body. Hypertension forces the heart to work harder to pump blood and may cause arteries to become narrow or stiff. Untreated or uncontrolled hypertension can lead to a heart attack, heart failure, a stroke, kidney disease, and other problems. A blood pressure reading consists of a higher number over a lower number. Ideally, your blood pressure should be below 120/80. The first ("top") number is called the systolic pressure. It is a measure of the pressure in your arteries as your heart beats. The second ("bottom") number is called the diastolic pressure. It is a measure of the pressure in your arteries as the heart relaxes. What are the causes? The exact cause of this condition is not known. There are some conditions that result in high blood pressure. What increases the risk? Certain factors may make you more likely to develop high blood pressure. Some of these risk factors are under your control, including: Smoking. Not getting enough exercise or physical activity. Being overweight. Having too much fat, sugar, calories, or  salt (sodium) in your diet. Drinking too much alcohol. Other risk factors include: Having a personal history of heart disease, diabetes, high cholesterol, or kidney disease. Stress. Having a family history of high blood pressure and high cholesterol. Having obstructive sleep apnea. Age. The risk increases with age. What are the signs or symptoms? High blood pressure may not cause symptoms. Very high blood pressure (hypertensive crisis) may cause: Headache. Fast or irregular heartbeats (palpitations). Shortness of breath. Nosebleed. Nausea and vomiting. Vision changes. Severe chest pain, dizziness, and seizures. How is this diagnosed? This condition is diagnosed by measuring your blood pressure while you are seated, with your arm resting on a flat surface, your legs uncrossed, and your feet flat on the floor. The cuff of the blood pressure monitor will be placed directly against the skin of your upper arm at the level of your heart. Blood pressure should be measured at least twice using the same arm. Certain conditions can cause a difference in blood pressure between your right and left arms. If you have a high blood pressure reading during one visit or you have normal blood pressure with other risk factors, you may be asked to: Return on a different day to have your blood pressure checked again. Monitor your blood pressure at home for 1 week or longer. If you are diagnosed with hypertension, you may have other blood or imaging tests to help your health care provider understand your overall risk for other conditions. How is this treated? This condition is treated by making healthy lifestyle changes, such as eating healthy foods, exercising more, and reducing your alcohol intake. You may be referred for counseling on a healthy diet and physical activity. Your health care provider may prescribe medicine if lifestyle changes are not enough to get your blood pressure under control and if: Your  systolic blood pressure is above 130. Your diastolic blood pressure is above 80. Your personal target blood pressure may vary depending on your medical conditions, your age, and other factors. Follow these instructions at home: Eating and drinking  Eat a diet that is high in fiber and potassium, and low in sodium, added sugar, and fat. An example of this eating plan is called  the DASH diet. DASH stands for Dietary Approaches to Stop Hypertension. To eat this way: Eat plenty of fresh fruits and vegetables. Try to fill one half of your plate at each meal with fruits and vegetables. Eat whole grains, such as whole-wheat pasta, brown rice, or whole-grain bread. Fill about one fourth of your plate with whole grains. Eat or drink low-fat dairy products, such as skim milk or low-fat yogurt. Avoid fatty cuts of meat, processed or cured meats, and poultry with skin. Fill about one fourth of your plate with lean proteins, such as fish, chicken without skin, beans, eggs, or tofu. Avoid pre-made and processed foods. These tend to be higher in sodium, added sugar, and fat. Reduce your daily sodium intake. Many people with hypertension should eat less than 1,500 mg of sodium a day. Do not drink alcohol if: Your health care provider tells you not to drink. You are pregnant, may be pregnant, or are planning to become pregnant. If you drink alcohol: Limit how much you have to: 0-1 drink a day for women. 0-2 drinks a day for men. Know how much alcohol is in your drink. In the [place] one drink equals one 12 oz bottle of beer (355 mL), one 5 oz glass of wine (148 mL), or one 1 oz glass of hard liquor (44 mL). Lifestyle  Work with your health care provider to maintain a healthy body weight or to lose weight. Ask what an ideal weight is for you. Get at least 30 minutes of exercise that causes your heart to beat faster (aerobic exercise) most days of the week. Activities may include walking, swimming, or  biking. Include exercise to strengthen your muscles (resistance exercise), such as Pilates or lifting weights, as part of your weekly exercise routine. Try to do these types of exercises for 30 minutes at least 3 days a week. Do not use any products that contain nicotine or tobacco. These products include cigarettes, chewing tobacco, and vaping devices, such as e-cigarettes. If you need help quitting, ask your health care provider. Monitor your blood pressure at home as told by your health care provider. Keep all follow-up visits. This is important. Medicines Take over-the-counter and prescription medicines only as told by your health care provider. Follow directions carefully. Blood pressure medicines must be taken as prescribed. Do not skip doses of blood pressure medicine. Doing this puts you at risk for problems and can make the medicine less effective. Ask your health care provider about side effects or reactions to medicines that you should watch for. Contact a health care provider if you: Think you are having a reaction to a medicine you are taking. Have headaches that keep coming back (recurring). Feel dizzy. Have swelling in your ankles. Have trouble with your vision. Get help right away if you: Develop a severe headache or confusion. Have unusual weakness or numbness. Feel faint. Have severe pain in your chest or abdomen. Vomit repeatedly. Have trouble breathing. These symptoms may be an emergency. Get help right away. Call 911. Do not wait to see if the symptoms will go away. Do not drive yourself to the hospital. Summary Hypertension is when the force of blood pumping through your arteries is too strong. If this condition is not controlled, it may put you at risk for serious complications. Your personal target blood pressure may vary depending on your medical conditions, your age, and other factors. For most people, a normal blood pressure is less than 120/80. Hypertension is  treated with  lifestyle changes, medicines, or a combination of both. Lifestyle changes include losing weight, eating a healthy, low-sodium diet, exercising more, and limiting alcohol. This information is not intended to replace advice given to you by your health care provider. Make sure you discuss any questions you have with your health care provider. Document Revised: 02/14/2021 Document Reviewed: 02/14/2021 Elsevier Patient Education  2024 Elsevier Inc.      Edwina Barth, MD [facility] at [facility]

## 2023-04-19 ENCOUNTER — Ambulatory Visit (HOSPITAL_COMMUNITY): Payer: BC Managed Care – PPO | Attending: Cardiology

## 2023-04-19 DIAGNOSIS — I10 Essential (primary) hypertension: Secondary | ICD-10-CM | POA: Insufficient documentation

## 2023-04-19 DIAGNOSIS — I061 Rheumatic aortic insufficiency: Secondary | ICD-10-CM | POA: Insufficient documentation

## 2023-04-19 LAB — ECHOCARDIOGRAM COMPLETE
AV Vena cont: 0.2 cm
S' Lateral: 2.65 cm

## 2023-05-09 ENCOUNTER — Other Ambulatory Visit: Payer: Self-pay

## 2023-05-09 ENCOUNTER — Ambulatory Visit (INDEPENDENT_AMBULATORY_CARE_PROVIDER_SITE_OTHER): Payer: BC Managed Care – PPO

## 2023-05-09 ENCOUNTER — Encounter: Payer: Self-pay | Admitting: Emergency Medicine

## 2023-05-09 ENCOUNTER — Ambulatory Visit
Admission: EM | Admit: 2023-05-09 | Discharge: 2023-05-09 | Disposition: A | Discharge status: Home / Self Care | Payer: BC Managed Care – PPO | Attending: Family Medicine | Admitting: Family Medicine

## 2023-05-09 DIAGNOSIS — R051 Acute cough: Secondary | ICD-10-CM | POA: Diagnosis not present

## 2023-05-09 DIAGNOSIS — I10 Essential (primary) hypertension: Secondary | ICD-10-CM

## 2023-05-09 DIAGNOSIS — R059 Cough, unspecified: Secondary | ICD-10-CM | POA: Diagnosis not present

## 2023-05-09 DIAGNOSIS — R0989 Other specified symptoms and signs involving the circulatory and respiratory systems: Secondary | ICD-10-CM | POA: Diagnosis not present

## 2023-05-09 DIAGNOSIS — J208 Acute bronchitis due to other specified organisms: Secondary | ICD-10-CM | POA: Diagnosis not present

## 2023-05-09 DIAGNOSIS — I700 Atherosclerosis of aorta: Secondary | ICD-10-CM | POA: Diagnosis not present

## 2023-05-09 MED ORDER — PREDNISONE 20 MG PO TABS: 40.0000 mg | ORAL_TABLET | Freq: Every day | ORAL | 0 refills | Status: DC

## 2023-05-09 MED ORDER — GUAIFENESIN ER 600 MG PO TB12: 600.0000 mg | ORAL_TABLET | Freq: Two times a day (BID) | ORAL | 0 refills | Status: AC

## 2023-05-09 MED ORDER — BENZONATATE 100 MG PO CAPS: 100.0000 mg | ORAL_CAPSULE | Freq: Three times a day (TID) | ORAL | 0 refills | Status: DC

## 2023-05-09 NOTE — Discharge Instructions (Signed)
We will let you know if anything comes back abnormal on your chest x-ray.  I suspect a viral respiratory infection causing your symptoms.  I have sent over some medications to help with your symptoms and you may take Coricidin HBP, Flonase, use humidifiers and sinus rinses, Tylenol as needed.  Follow-up for significantly worsening symptoms.

## 2023-05-09 NOTE — ED Triage Notes (Signed)
Pt reports cough since yesterday, reports intermittent headache. Denies any known fevers. Reports "the cough is getting worse and nothing is coming out and its starting to burn in my chest."

## 2023-05-13 NOTE — ED Provider Notes (Signed)
[facility]    CSN: [id number] Arrival date & time: 05/09/23  0803      History   Chief Complaint Chief Complaint  Patient presents with   Cough    HPI Randy [name] is a 63 y.o. male.   Patient presenting today with 1 day history of headache, hacking worsening productive cough, burning pain in chest, chest tightness, congestion.  Denies fever, chest pain, abdominal pain, nausea vomiting or diarrhea.  Denies any known history of chronic pulmonary disease but does state he is prone to bronchitis.  So far not tried anything over-the-counter for symptoms.    Past Medical History:  Diagnosis Date   HTN (hypertension)    Hyperlipidemia    Kidney stones    Leaky heart valve    small    Patient Active Problem List   Diagnosis Date Noted   Dyslipidemia 12/18/2022   History of rheumatic fever 12/18/2022   Lower urinary tract symptoms 12/18/2022   Mixed hyperlipidemia 10/29/2019   Chronic constipation 10/29/2019   Essential hypertension 02/11/2015    Past Surgical History:  Procedure Laterality Date   APPENDECTOMY     ORIF ZYGOMATIC FRACTURE Right        Home Medications    Prior to Admission medications   Medication Sig Start Date End Date Taking? Authorizing Provider  benzonatate (TESSALON) 100 MG capsule Take 1 capsule (100 mg total) by mouth every 8 (eight) hours. 05/09/23  Yes Particia Nearing, PA-C  guaiFENesin (MUCINEX) 600 MG 12 hr tablet Take 1 tablet (600 mg total) by mouth 2 (two) times daily. 05/09/23  Yes Particia Nearing, PA-C  predniSONE (DELTASONE) 20 MG tablet Take 2 tablets (40 mg total) by mouth daily with breakfast. 05/09/23  Yes Particia Nearing, PA-C  amLODipine (NORVASC) 10 MG tablet Take 1 tablet (10 mg total) by mouth daily. 12/18/22   Georgina Quint, MD  atorvastatin (LIPITOR) 20 MG tablet Take 1 tablet (20 mg total) by mouth daily. 12/18/22   Georgina Quint, MD  fluticasone [facility] 50 MCG/ACT  nasal spray Place 2 sprays into both nostrils daily. Patient not taking: Reported on 12/18/2022 10/29/19   Lezlie Lye, Meda Coffee, MD  levocetirizine (XYZAL) 5 MG tablet Take 1 tablet (5 mg total) by mouth every evening. Patient not taking: Reported on 12/18/2022 09/25/20   Bing Neighbors, NP  Multiple Vitamin (MULTIVITAMIN) tablet Take 1 tablet by mouth daily. Patient not taking: Reported on 12/18/2022    [provider]  polyethylene glycol powder (GLYCOLAX/MIRALAX) 17 GM/SCOOP powder Take 17 g by mouth 2 (two) times daily as needed for moderate constipation. Patient not taking: Reported on 12/18/2022 10/29/19   Lezlie Lye, Meda Coffee, MD    Family History Family History  Problem Relation Age of Onset   Diabetes Mother    Hypertension Father    Kidney disease Father    Colon cancer Neg Hx    Stomach cancer Neg Hx    Rectal cancer Neg Hx    Esophageal cancer Neg Hx     Social History Social History   Tobacco Use   Smoking status: Never   Smokeless tobacco: Never  Vaping Use   Vaping status: Never Used  Substance Use Topics   Alcohol use: No    Alcohol/week: 0.0 standard drinks of alcohol   Drug use: No     Allergies   Patient has no known allergies.   Review of Systems Review of Systems Per HPI  Physical Exam Triage Vital Signs ED Triage Vitals  Encounter Vitals Group     BP 05/09/23 0830 (!) 167/92     Systolic BP Percentile --      Diastolic BP Percentile --      Pulse Rate 05/09/23 0830 97     Resp 05/09/23 0830 20     Temp 05/09/23 0830 99.3 F (37.4 C)     Temp Source 05/09/23 0830 Oral     SpO2 05/09/23 0830 97 %     Weight --      Height --      Head Circumference --      Peak Flow --      Pain Score 05/09/23 0828 0     Pain Loc --      Pain Education --      Exclude from Growth Chart --    No data found.  Updated Vital Signs BP (!) 167/92 (BP Location: Right Arm) Comment: has not taken bp med this am.  Pulse 97   Temp 99.3 F (37.4 C)  (Oral)   Resp 20   SpO2 97%   Visual Acuity Right Eye Distance:   Left Eye Distance:   Bilateral Distance:    Right Eye Near:   Left Eye Near:    Bilateral Near:     Physical Exam Vitals and nursing note reviewed.  Constitutional:      Appearance: He is well-developed.  HENT:     Head: Atraumatic.     Right Ear: External ear normal.     Left Ear: External ear normal.     Nose: Rhinorrhea present.     Mouth/Throat:     Pharynx: Posterior oropharyngeal erythema present. No oropharyngeal exudate.  Eyes:     Conjunctiva/sclera: Conjunctivae normal.     Pupils: Pupils are equal, round, and reactive to light.  Cardiovascular:     Rate and Rhythm: Normal rate and regular rhythm.  Pulmonary:     Effort: Pulmonary effort is normal. No respiratory distress.     Breath sounds: Wheezing present. No rales.  Musculoskeletal:        General: Normal range of motion.     Cervical back: Normal range of motion and neck supple.  Lymphadenopathy:     Cervical: No cervical adenopathy.  Skin:    General: Skin is warm and dry.  Neurological:     Mental Status: He is alert and oriented to person, place, and time.  Psychiatric:        Behavior: Behavior normal.      UC Treatments / Results  Labs (all labs ordered are listed, but only abnormal results are displayed) Labs Reviewed - No data to display  EKG   Radiology No results found.  Procedures Procedures (including critical care time)  Medications Ordered in UC Medications - No data to display  Initial Impression / Assessment and Plan / UC Course  I have reviewed the triage vital signs and the nursing notes.  Pertinent labs & imaging results that were available during my care of the patient were reviewed by me and considered in my medical decision making (see chart for details).     Hypertensive in triage, otherwise vital signs reassuring.  Discussed take over-the-counter cold and congestion medications that would not  continue to elevate blood pressure in addition to treating for viral bronchitis with prednisone, Tessalon, Mucinex.  Return for worsening symptoms.  Final Clinical Impressions(s) / UC Diagnoses   Final diagnoses:  Acute  cough  Viral bronchitis  Elevated blood pressure reading with diagnosis of hypertension     Discharge Instructions      We will let you know if anything comes back abnormal on your chest x-ray.  I suspect a viral respiratory infection causing your symptoms.  I have sent over some medications to help with your symptoms and you may take Coricidin HBP, Flonase, use humidifiers and sinus rinses, Tylenol as needed.  Follow-up for significantly worsening symptoms.    ED Prescriptions     Medication Sig Dispense Auth. Provider   predniSONE (DELTASONE) 20 MG tablet Take 2 tablets (40 mg total) by mouth daily with breakfast. 6 tablet Particia Nearing, PA-C   benzonatate (TESSALON) 100 MG capsule Take 1 capsule (100 mg total) by mouth every 8 (eight) hours. 21 capsule Particia Nearing, [place]   guaiFENesin (MUCINEX) 600 MG 12 hr tablet Take 1 tablet (600 mg total) by mouth 2 (two) times daily. 20 tablet Particia Nearing, [place]      PDMP not reviewed this encounter.   Particia Nearing, [place] 05/13/23 1158

## 2023-06-20 ENCOUNTER — Ambulatory Visit: Payer: BC Managed Care – PPO | Admitting: Emergency Medicine

## 2023-07-19 ENCOUNTER — Encounter: Payer: Self-pay | Admitting: Emergency Medicine

## 2023-07-19 ENCOUNTER — Ambulatory Visit (HOSPITAL_COMMUNITY)
Admission: RE | Admit: 2023-07-19 | Discharge: 2023-07-19 | Disposition: A | Discharge status: Home / Self Care | Source: Ambulatory Visit | Attending: *Deleted | Admitting: *Deleted

## 2023-07-19 ENCOUNTER — Ambulatory Visit
Admission: EM | Admit: 2023-07-19 | Discharge: 2023-07-19 | Disposition: A | Discharge status: Home / Self Care | Attending: Nurse Practitioner | Admitting: Nurse Practitioner

## 2023-07-19 ENCOUNTER — Other Ambulatory Visit: Payer: Self-pay

## 2023-07-19 DIAGNOSIS — M79662 Pain in left lower leg: Secondary | ICD-10-CM | POA: Insufficient documentation

## 2023-07-19 NOTE — ED Triage Notes (Signed)
 Pt reports left calf pain with ambulation x1 week. Denies any known injury,swelling, or taking blood thinners.

## 2023-07-19 NOTE — ED Provider Notes (Signed)
 [facility]    CSN: [id number] Arrival date & time: 07/19/23  0800      History   Chief Complaint Chief Complaint  Patient presents with   Leg Pain    HPI Randy [name] is a 63 y.o. male.   The history is provided by the patient.   Patient presents for complaints of pain in the left calf.  Patient states symptoms started over the past week.  Patient states pain worsens with ambulation.  He states that he has not had any redness, swelling, or tenderness to the calf.  Patient denies injury or trauma, also states that he does not smoke and has not had any recent travel.  Denies chest pain, shortness of breath, difficulty breathing, abdominal pain, nausea, or vomiting.  Of note, patient has history of hypertension and hyperlipidemia along with a "leaky heart valve."  States he is not taking any medication for symptoms.  States over the past week, symptoms have somewhat improved.  Past Medical History:  Diagnosis Date   HTN (hypertension)    Hyperlipidemia    Kidney stones    Leaky heart valve    small    Patient Active Problem List   Diagnosis Date Noted   Dyslipidemia 12/18/2022   History of rheumatic fever 12/18/2022   Lower urinary tract symptoms 12/18/2022   Mixed hyperlipidemia 10/29/2019   Chronic constipation 10/29/2019   Essential hypertension 02/11/2015    Past Surgical History:  Procedure Laterality Date   APPENDECTOMY     ORIF ZYGOMATIC FRACTURE Right        Home Medications    Prior to Admission medications   Medication Sig Start Date End Date Taking? Authorizing Provider  amLODipine (NORVASC) 10 MG tablet Take 1 tablet (10 mg total) by mouth daily. 12/18/22   Georgina Quint, MD  atorvastatin (LIPITOR) 20 MG tablet Take 1 tablet (20 mg total) by mouth daily. 12/18/22   Georgina Quint, MD  benzonatate (TESSALON) 100 MG capsule Take 1 capsule (100 mg total) by mouth every 8 (eight) hours. 05/09/23   Particia Nearing, PA-C   fluticasone [facility]) 50 MCG/ACT nasal spray Place 2 sprays into both nostrils daily. Patient not taking: Reported on 12/18/2022 10/29/19   Lezlie Lye, Meda Coffee, MD  guaiFENesin (MUCINEX) 600 MG 12 hr tablet Take 1 tablet (600 mg total) by mouth 2 (two) times daily. 05/09/23   Particia Nearing, PA-C  levocetirizine (XYZAL) 5 MG tablet Take 1 tablet (5 mg total) by mouth every evening. Patient not taking: Reported on 12/18/2022 09/25/20   Bing Neighbors, NP  Multiple Vitamin (MULTIVITAMIN) tablet Take 1 tablet by mouth daily. Patient not taking: Reported on 12/18/2022    [provider]  polyethylene glycol powder (GLYCOLAX/MIRALAX) 17 GM/SCOOP powder Take 17 g by mouth 2 (two) times daily as needed for moderate constipation. Patient not taking: Reported on 12/18/2022 10/29/19   Lezlie Lye, Meda Coffee, MD  predniSONE (DELTASONE) 20 MG tablet Take 2 tablets (40 mg total) by mouth daily with breakfast. 05/09/23   Particia Nearing, PA-C    Family History Family History  Problem Relation Age of Onset   Diabetes Mother    Hypertension Father    Kidney disease Father    Colon cancer Neg Hx    Stomach cancer Neg Hx    Rectal cancer Neg Hx    Esophageal cancer Neg Hx     Social History Social History   Tobacco Use  Smoking status: Never   Smokeless tobacco: Never  Vaping Use   Vaping status: Never Used  Substance Use Topics   Alcohol use: No    Alcohol/week: 0.0 standard drinks of alcohol   Drug use: No     Allergies   Patient has no known allergies.   Review of Systems Review of Systems Per HPI  Physical Exam Triage Vital Signs ED Triage Vitals [07/19/23 0823]  Encounter Vitals Group     BP (!) 165/97     Systolic BP Percentile      Diastolic BP Percentile      Pulse Rate 94     Resp 20     Temp 98.7 F (37.1 C)     Temp Source Oral     SpO2 95 %     Weight      Height      Head Circumference      Peak Flow      Pain Score 5     Pain Loc       Pain Education      Exclude from Growth Chart    No data found.  Updated Vital Signs BP (!) 165/97 (BP Location: Right Arm)   Pulse 94   Temp 98.7 F (37.1 C) (Oral)   Resp 20   SpO2 95%   Visual Acuity Right Eye Distance:   Left Eye Distance:   Bilateral Distance:    Right Eye Near:   Left Eye Near:    Bilateral Near:     Physical Exam Vitals and nursing note reviewed.  Constitutional:      General: He is not in acute distress.    Appearance: Normal appearance.  HENT:     Head: Normocephalic.  Eyes:     Extraocular Movements: Extraocular movements intact.     Pupils: Pupils are equal, round, and reactive to light.  Cardiovascular:     Rate and Rhythm: Normal rate and regular rhythm.     Pulses: Normal pulses.     Heart sounds: Normal heart sounds.  Pulmonary:     Effort: Pulmonary effort is normal. No respiratory distress.     Breath sounds: Normal breath sounds. No stridor. No wheezing, rhonchi or rales.  Abdominal:     General: Bowel sounds are normal.     Palpations: Abdomen is soft.     Tenderness: There is no abdominal tenderness.  Musculoskeletal:     Cervical back: Normal range of motion.     Left lower leg: Tenderness present. No swelling. No edema.     Comments: Negative Homan's sign. No redness, warmth or swelling present.  Skin:    General: Skin is warm and dry.  Neurological:     General: No focal deficit present.     Mental Status: He is alert and oriented to person, place, and time.  Psychiatric:        Mood and Affect: Mood normal.        Behavior: Behavior normal.      UC Treatments / Results  Labs (all labs ordered are listed, but only abnormal results are displayed) Labs Reviewed - No data to display  EKG   Radiology US Venous Img Lower Unilateral Left (DVT) Result Date: 07/19/2023 CLINICAL DATA:  Left calf pain. EXAM: LEFT LOWER EXTREMITY VENOUS DOPPLER ULTRASOUND TECHNIQUE: Gray-scale sonography with graded compression, as well  as color Doppler and duplex ultrasound were performed to evaluate the lower extremity deep venous systems from the level of  the common femoral vein and including the common femoral, femoral, profunda femoral, popliteal and calf veins including the posterior tibial, peroneal and gastrocnemius veins when visible. The superficial great saphenous vein was also interrogated. Spectral Doppler was utilized to evaluate flow at rest and with distal augmentation maneuvers in the common femoral, femoral and popliteal veins. COMPARISON:  None Available. FINDINGS: Contralateral Common Femoral Vein: Respiratory phasicity is normal and symmetric with the symptomatic side. No evidence of thrombus. Normal compressibility. Common Femoral Vein: No evidence of thrombus. Normal compressibility, respiratory phasicity and response to augmentation. Saphenofemoral Junction: No evidence of thrombus. Normal compressibility and flow on color Doppler imaging. Profunda Femoral Vein: No evidence of thrombus. Normal compressibility and flow on color Doppler imaging. Femoral Vein: No evidence of thrombus. Normal compressibility, respiratory phasicity and response to augmentation. Popliteal Vein: No evidence of thrombus. Normal compressibility, respiratory phasicity and response to augmentation. Calf Veins: No evidence of thrombus. Normal compressibility and flow on color Doppler imaging. Superficial Great Saphenous Vein: No evidence of thrombus. Normal compressibility. Venous Reflux:  None. Other Findings: No evidence of superficial thrombophlebitis or abnormal fluid collection. IMPRESSION: No evidence of left lower extremity deep venous thrombosis. Electronically Signed   By: Irish Lack M.D.   On: 07/19/2023 10:14    Procedures Procedures (including critical care time)  Medications Ordered in UC Medications - No data to display  Initial Impression / Assessment and Plan / UC Course  I have reviewed the triage vital signs and the  nursing notes.  Pertinent labs & imaging results that were available during my care of the patient were reviewed by me and considered in my medical decision making (see chart for details).  Patient referred to [facility] for ultrasound of the left lower extremity.  On exam, Denna Haggard' sign was negative, patient has no increased redness, swelling, or tenderness to the left calf.  Low suspicion for DVT; however cannot rule out.  If ultrasound is negative, symptoms likely caused by a muscle strain or sprain.  Supportive care recommendations were provided and discussed with the patient to include over-the-counter analgesics, the use of ice or heat, and stretching exercises.  Patient was advised he will be contacted with the results of the ultrasound are received.  Patient verbalized understanding.  All questions were answered.  Patient stable for discharge.  Update: Ultrasound results of the left calf were received, reviewed results, negative for DVT.  Call patient to discuss results.  Verified patient with 2 patient identifiers.  Patient advised ultrasound was negative for DVT and to continue with current plan of care.  Patient states "is actually feeling better now."  Patient verbalized understanding.  All questions were answered.   Final Clinical Impressions(s) / UC Diagnoses   Final diagnoses:  Pain of left calf     Discharge Instructions      Please go to [facility] for an ultrasound of your left calf muscle.  You will need to go to the main entrance of the hospital to get to the radiology department.  You will be contacted when the results of the x-ray are received. May take over-the-counter Tylenol as needed for pain or discomfort. Also recommend the use of ice or heat.  Apply ice for pain or swelling, heat for spasm or stiffness.  Apply for 20 minutes, remove for 1 hour, repeat as needed. Gentle stretching of the left calf muscle while symptoms persist. Follow-up as  needed.     ED Prescriptions   None  PDMP not reviewed this encounter.   Abran Cantor, NP 07/19/23 1145

## 2023-07-19 NOTE — ED Notes (Signed)
 Called centralized scheduling and reported pt can present to [facility] post discharge from UC for [place].

## 2023-07-19 NOTE — Discharge Instructions (Addendum)
 Please go to [facility] for an ultrasound of your left calf muscle.  You will need to go to the main entrance of the hospital to get to the radiology department.  You will be contacted when the results of the x-ray are received. May take over-the-counter Tylenol as needed for pain or discomfort. Also recommend the use of ice or heat.  Apply ice for pain or swelling, heat for spasm or stiffness.  Apply for 20 minutes, remove for 1 hour, repeat as needed. Gentle stretching of the left calf muscle while symptoms persist. Follow-up as needed.

## 2023-12-20 ENCOUNTER — Other Ambulatory Visit: Payer: Self-pay | Admitting: Emergency Medicine

## 2023-12-20 DIAGNOSIS — E785 Hyperlipidemia, unspecified: Secondary | ICD-10-CM

## 2023-12-23 ENCOUNTER — Other Ambulatory Visit: Payer: Self-pay | Admitting: Emergency Medicine

## 2023-12-23 DIAGNOSIS — I10 Essential (primary) hypertension: Secondary | ICD-10-CM

## 2024-01-23 NOTE — Telephone Encounter (Signed)
 Error

## 2024-01-27 ENCOUNTER — Ambulatory Visit
Admission: EM | Admit: 2024-01-27 | Discharge: 2024-01-27 | Disposition: A | Discharge status: Home / Self Care | Attending: Nurse Practitioner | Admitting: Nurse Practitioner

## 2024-01-27 DIAGNOSIS — B356 Tinea cruris: Secondary | ICD-10-CM | POA: Diagnosis not present

## 2024-01-27 MED ORDER — CLOTRIMAZOLE 1 % EX CREA: TOPICAL_CREAM | CUTANEOUS | 0 refills | Status: AC

## 2024-01-27 NOTE — Discharge Instructions (Signed)
 The rash in your groin, scrotum, and rectum is a fungal rash.  Use the Lotrimin cream twice daily until it clears up.  Try to change out of damp clothes promptly to prevent recurrence.  Seek care if symptoms do not improve with treatment.

## 2024-01-27 NOTE — ED Provider Notes (Signed)
 [facility]    CSN: [id number] Arrival date & time: 01/27/24  1530      History   Chief Complaint Chief Complaint  Patient presents with   Rash    HPI Randy [name] is a 63 y.o. male.   Patient presents today for 3 week history of itchy rash to bilateral inner thighs, groin, scrotum, and rectum.  No drainage, oozing.  Reports the rash is scaly and itchy.  Has applied hydrocortisone which seems to help with the itching.  Reports he gets very sweaty when he works and he applies Vaseline prior to working to prevent chaffing. No history of similar.  No fever, nausea/vomiting or concern for STI today.    Past Medical History:  Diagnosis Date   HTN (hypertension)    Hyperlipidemia    Kidney stones    Leaky heart valve    small    Patient Active Problem List   Diagnosis Date Noted   Dyslipidemia 12/18/2022   History of rheumatic fever 12/18/2022   Lower urinary tract symptoms 12/18/2022   Mixed hyperlipidemia 10/29/2019   Chronic constipation 10/29/2019   Essential hypertension 02/11/2015    Past Surgical History:  Procedure Laterality Date   APPENDECTOMY     ORIF ZYGOMATIC FRACTURE Right        Home Medications    Prior to Admission medications   Medication Sig Start Date End Date Taking? Authorizing Provider  amLODipine  (NORVASC ) 10 MG tablet TAKE 1 TABLET BY MOUTH EVERY DAY 12/24/23  Yes Sagardia, Emil Schanz, MD  atorvastatin  (LIPITOR) 20 MG tablet TAKE 1 TABLET BY MOUTH EVERY DAY 12/20/23  Yes Sagardia, Miguel Jose, MD  clotrimazole (LOTRIMIN) 1 % cream Apply to affected area 2 times daily 01/27/24  Yes Chandra Raisin A, NP  fluticasone  (FLONASE ) 50 MCG/ACT nasal spray Place 2 sprays into both nostrils daily. Patient not taking: Reported on 12/18/2022 10/29/19   Melonie Colonel, Mikel HERO, MD  guaiFENesin  (MUCINEX ) 600 MG 12 hr tablet Take 1 tablet (600 mg total) by mouth 2 (two) times daily. 05/09/23   Stuart Vernell Norris, PA-C  levocetirizine  (XYZAL ) 5 MG tablet Take 1 tablet (5 mg total) by mouth every evening. Patient not taking: Reported on 12/18/2022 09/25/20   Arloa Suzen RAMAN, NP  Multiple Vitamin (MULTIVITAMIN) tablet Take 1 tablet by mouth daily. Patient not taking: Reported on 12/18/2022    [provider]  polyethylene glycol powder (GLYCOLAX /MIRALAX ) 17 GM/SCOOP powder Take 17 g by mouth 2 (two) times daily as needed for moderate constipation. Patient not taking: Reported on 12/18/2022 10/29/19   Melonie Colonel, Mikel HERO, MD    Family History Family History  Problem Relation Age of Onset   Diabetes Mother    Hypertension Father    Kidney disease Father    Colon cancer Neg Hx    Stomach cancer Neg Hx    Rectal cancer Neg Hx    Esophageal cancer Neg Hx     Social History Social History   Tobacco Use   Smoking status: Never   Smokeless tobacco: Never  Vaping Use   Vaping status: Never Used  Substance Use Topics   Alcohol use: No    Alcohol/week: 0.0 standard drinks of alcohol   Drug use: No     Allergies   Patient has no known allergies.   Review of Systems Review of Systems Per HPI  Physical Exam Triage Vital Signs ED Triage Vitals  Encounter Vitals Group     BP  01/27/24 1554 138/83     Girls Systolic BP Percentile --      Girls Diastolic BP Percentile --      Boys Systolic BP Percentile --      Boys Diastolic BP Percentile --      Pulse Rate 01/27/24 1554 99     Resp 01/27/24 1554 16     Temp 01/27/24 1554 98.5 F (36.9 C)     Temp Source 01/27/24 1554 Oral     SpO2 01/27/24 1554 95 %     Weight --      Height --      Head Circumference --      Peak Flow --      Pain Score 01/27/24 1556 0     Pain Loc --      Pain Education --      Exclude from Growth Chart --    No data found.  Updated Vital Signs BP 138/83 (BP Location: Right Arm)   Pulse 99   Temp 98.5 F (36.9 C) (Oral)   Resp 16   SpO2 95%   Visual Acuity Right Eye Distance:   Left Eye Distance:   Bilateral  Distance:    Right Eye Near:   Left Eye Near:    Bilateral Near:     Physical Exam Vitals and nursing note reviewed. Exam conducted with a chaperone present America Sierras, NP).  Constitutional:      General: He is not in acute distress.    Appearance: Normal appearance. He is not toxic-appearing.  Pulmonary:     Effort: Pulmonary effort is normal. No respiratory distress.  Genitourinary:    Pubic Area: Rash present.     Comments: Scaly, hypopigmented rash to bilateral inner thigh, scrotum, rectum without edema, erythema, warmth, or active drainage Skin:    General: Skin is warm and dry.     Capillary Refill: Capillary refill takes less than 2 seconds.     Coloration: Skin is not jaundiced or pale.  Neurological:     Mental Status: He is alert and oriented to person, place, and time.     Motor: No weakness.     Gait: Gait normal.  Psychiatric:        Behavior: Behavior is cooperative.      UC Treatments / Results  Labs (all labs ordered are listed, but only abnormal results are displayed) Labs Reviewed - No data to display  EKG   Radiology No results found.  Procedures Procedures (including critical care time)  Medications Ordered in UC Medications - No data to display  Initial Impression / Assessment and Plan / UC Course  I have reviewed the triage vital signs and the nursing notes.  Pertinent labs & imaging results that were available during my care of the patient were reviewed by me and considered in my medical decision making (see chart for details).   Patient is well-appearing, normotensive, afebrile, not tachycardic, not tachypneic, oxygenating well on room air.   1. Tinea cruris Treat with topical clotrimazole twice daily Recommended changing out of wet clothing promptly Return and ER precautions discussed   The patient was given the opportunity to ask questions.  All questions answered to their satisfaction.  The patient is in agreement to  this plan.   Final Clinical Impressions(s) / UC Diagnoses   Final diagnoses:  Tinea cruris     Discharge Instructions      The rash in your groin, scrotum, and rectum is a fungal  rash.  Use the Lotrimin cream twice daily until it clears up.  Try to change out of damp clothes promptly to prevent recurrence.  Seek care if symptoms do not improve with treatment.   ED Prescriptions     Medication Sig Dispense Auth. Provider   clotrimazole (LOTRIMIN) 1 % cream Apply to affected area 2 times daily 113 g Chandra Harlene LABOR, NP      PDMP not reviewed this encounter.   Chandra Harlene LABOR, NP 01/27/24 [phone]

## 2024-01-27 NOTE — ED Triage Notes (Signed)
 Pt has a itchy rash on inner thighs and scrotum x 3 weeks.  Tried Cortizone cream and states it did help.

## 2024-02-14 ENCOUNTER — Ambulatory Visit: Payer: Self-pay

## 2024-02-15 ENCOUNTER — Ambulatory Visit
Admission: RE | Admit: 2024-02-15 | Discharge: 2024-02-15 | Disposition: A | Discharge status: Home / Self Care | Source: Ambulatory Visit | Attending: Nurse Practitioner | Admitting: Nurse Practitioner

## 2024-02-15 VITALS — BP 172/89 | HR 76 | Temp 97.8°F | Resp 18

## 2024-02-15 DIAGNOSIS — R21 Rash and other nonspecific skin eruption: Secondary | ICD-10-CM

## 2024-02-15 MED ORDER — NYSTATIN-TRIAMCINOLONE 100000-0.1 UNIT/GM-% EX CREA: TOPICAL_CREAM | CUTANEOUS | 0 refills | Status: AC

## 2024-02-15 MED ORDER — PREDNISONE 20 MG PO TABS: 40.0000 mg | ORAL_TABLET | Freq: Every day | ORAL | 0 refills | Status: AC

## 2024-02-15 MED ORDER — DEXAMETHASONE SOD PHOSPHATE PF 10 MG/ML IJ SOLN
10.0000 mg | Freq: Once | INTRAMUSCULAR | Status: AC
  Administered 2024-02-15: 10 mg via INTRAMUSCULAR

## 2024-02-15 NOTE — Discharge Instructions (Addendum)
 You were given an injection of Decadron  10 mg today.  Start the prednisone  tomorrow. Take medication as prescribed. You may take over-the-counter antihistamine such as Zyrtec , Claritin, or Allegra during the daytime and Benadryl at bedtime as needed for itching. Avoid hot baths or showers while symptoms persist.  Recommend taking lukewarm baths. May apply cool cloths to the area to help with itching or discomfort. Avoid scratching, rubbing, or manipulating the areas while symptoms persist. Recommend Aveeno Colloidal Oatmeal Bath to use to help with drying and itching. As discussed, if your symptoms fail to improve with this treatment, please follow-up with your primary care physician or with dermatology for further evaluation. Follow-up as needed.

## 2024-02-15 NOTE — ED Provider Notes (Signed)
 [facility]    CSN: [id number] Arrival date & time: 02/15/24  0905      History   Chief Complaint No chief complaint on file.   HPI Randy [name] is a 63 y.o. male.   The history is provided by the patient.   Patient presents for complaints of rash to his bilateral thighs and lower leg.  Patient states he was seen in this clinic approximately 2 weeks ago, states that he was given clotrimazole, states that the cream did help the rash around his genitalia and rectal area, but states that the rash never completely went away.  Patient states he was diagnosed with a fungal rash at that time.  He states that the rash in between his thighs is itchy.  He states that he has not been exposed to new lotions, medications, foods, detergents, or plants.  He further denies fever, chills, or drainage from the area.  Patient states that the rash is worse when he sweats, but states that he has not experienced as much sweating since the weather has cooled off.  Patient denies prior history of eczema or psoriasis.  Past Medical History:  Diagnosis Date   HTN (hypertension)    Hyperlipidemia    Kidney stones    Leaky heart valve    small    Patient Active Problem List   Diagnosis Date Noted   Dyslipidemia 12/18/2022   History of rheumatic fever 12/18/2022   Lower urinary tract symptoms 12/18/2022   Mixed hyperlipidemia 10/29/2019   Chronic constipation 10/29/2019   Essential hypertension 02/11/2015    Past Surgical History:  Procedure Laterality Date   APPENDECTOMY     ORIF ZYGOMATIC FRACTURE Right        Home Medications    Prior to Admission medications   Medication Sig Start Date End Date Taking? Authorizing Provider  amLODipine  (NORVASC ) 10 MG tablet TAKE 1 TABLET BY MOUTH EVERY DAY 12/24/23   Purcell Emil Schanz, MD  atorvastatin  (LIPITOR) 20 MG tablet TAKE 1 TABLET BY MOUTH EVERY DAY 12/20/23   Sagardia, Miguel Jose, MD  clotrimazole (LOTRIMIN) 1 % cream Apply  to affected area 2 times daily 01/27/24   Chandra Harlene DELENA, NP  fluticasone  (FLONASE ) 50 MCG/ACT nasal spray Place 2 sprays into both nostrils daily. Patient not taking: Reported on 12/18/2022 10/29/19   Melonie Colonel, Mikel HERO, MD  guaiFENesin  (MUCINEX ) 600 MG 12 hr tablet Take 1 tablet (600 mg total) by mouth 2 (two) times daily. 05/09/23   Stuart Vernell Norris, PA-C  levocetirizine (XYZAL ) 5 MG tablet Take 1 tablet (5 mg total) by mouth every evening. Patient not taking: Reported on 12/18/2022 09/25/20   Arloa Suzen RAMAN, NP  Multiple Vitamin (MULTIVITAMIN) tablet Take 1 tablet by mouth daily. Patient not taking: Reported on 12/18/2022    [provider]  polyethylene glycol powder (GLYCOLAX /MIRALAX ) 17 GM/SCOOP powder Take 17 g by mouth 2 (two) times daily as needed for moderate constipation. Patient not taking: Reported on 12/18/2022 10/29/19   Melonie Colonel, Mikel HERO, MD    Family History Family History  Problem Relation Age of Onset   Diabetes Mother    Hypertension Father    Kidney disease Father    Colon cancer Neg Hx    Stomach cancer Neg Hx    Rectal cancer Neg Hx    Esophageal cancer Neg Hx     Social History Social History   Tobacco Use   Smoking status: Never   Smokeless tobacco:  Never  Vaping Use   Vaping status: Never Used  Substance Use Topics   Alcohol use: No    Alcohol/week: 0.0 standard drinks of alcohol   Drug use: No     Allergies   Patient has no known allergies.   Review of Systems Review of Systems Per HPI  Physical Exam Triage Vital Signs ED Triage Vitals  Encounter Vitals Group     BP 02/15/24 0920 (!) 172/89     Girls Systolic BP Percentile --      Girls Diastolic BP Percentile --      Boys Systolic BP Percentile --      Boys Diastolic BP Percentile --      Pulse Rate 02/15/24 0920 76     Resp 02/15/24 0920 18     Temp 02/15/24 0920 97.8 F (36.6 C)     Temp Source 02/15/24 0920 Oral     SpO2 02/15/24 0920 95 %     Weight --       Height --      Head Circumference --      Peak Flow --      Pain Score 02/15/24 0921 0     Pain Loc --      Pain Education --      Exclude from Growth Chart --    No data found.  Updated Vital Signs BP (!) 172/89 (BP Location: Right Arm)   Pulse 76   Temp 97.8 F (36.6 C) (Oral)   Resp 18   SpO2 95%   Visual Acuity Right Eye Distance:   Left Eye Distance:   Bilateral Distance:    Right Eye Near:   Left Eye Near:    Bilateral Near:     Physical Exam Vitals and nursing note reviewed.  Constitutional:      General: He is not in acute distress.    Appearance: Normal appearance.  HENT:     Head: Normocephalic.  Eyes:     Extraocular Movements: Extraocular movements intact.     Pupils: Pupils are equal, round, and reactive to light.  Pulmonary:     Effort: Pulmonary effort is normal.  Musculoskeletal:     Cervical back: Normal range of motion.  Skin:    General: Skin is warm and dry.     Findings: Rash present.     Comments: Scaly, hypopigmented rash to bilateral inner thighs and to the left lower leg.  There is no oozing, fluctuance, or drainage present.  Neurological:     General: No focal deficit present.     Mental Status: He is alert and oriented to person, place, and time.  Psychiatric:        Mood and Affect: Mood normal.        Behavior: Behavior normal.      UC Treatments / Results  Labs (all labs ordered are listed, but only abnormal results are displayed) Labs Reviewed - No data to display  EKG   Radiology No results found.  Procedures Procedures (including critical care time)  Medications Ordered in UC Medications - No data to display  Initial Impression / Assessment and Plan / UC Course  I have reviewed the triage vital signs and the nursing notes.  Pertinent labs & imaging results that were available during my care of the patient were reviewed by me and considered in my medical decision making (see chart for details).  Patient  presents in follow-up for continued rash.  On exam, patient with  continued scaly, hypopigmented rash to the bilateral inner thighs into the lower leg.  Patient previously diagnosed with tinea cruris.  Rash appears that it may have some fungal etiology; however, cannot rule out atopic dermatitis.  Decadron  10 mg IM administered for itching and inflammation.  Will start patient on prednisone  40 mg for the next 5 days to help with inflammation and itching along with Mycolog cream for patient to apply topically.  Supportive care recommendations were provided and discussed with the patient to include use of over-the-counter antihistamines, avoiding hot baths or showers, and applying cool compresses to the affected area.  Patient was advised if the rash fails to improve with this treatment, it is recommended that he follow-up with his primary care physician for further evaluation.  Patient was in agreement with this plan of care and verbalizes understanding.  All questions were answered.  Patient stable for discharge.  Final Clinical Impressions(s) / UC Diagnoses   Final diagnoses:  None   Discharge Instructions   None    ED Prescriptions   None    PDMP not reviewed this encounter.   Gilmer Etta PARAS, NP 02/15/24 [phone]

## 2024-02-15 NOTE — ED Triage Notes (Signed)
 Pt reports he is still experiencing a rash on his inner thigh areas since visit on 01/27/24.   States cream that was prescribed did not help

## 2024-04-26 ENCOUNTER — Ambulatory Visit
Admission: RE | Admit: 2024-04-26 | Discharge: 2024-04-26 | Disposition: A | Discharge status: Home / Self Care | Source: Ambulatory Visit | Attending: Nurse Practitioner | Admitting: Nurse Practitioner

## 2024-04-26 VITALS — BP 163/96 | HR 91 | Temp 98.4°F | Resp 16

## 2024-04-26 DIAGNOSIS — Z889 Allergy status to unspecified drugs, medicaments and biological substances status: Secondary | ICD-10-CM

## 2024-04-26 DIAGNOSIS — J309 Allergic rhinitis, unspecified: Secondary | ICD-10-CM

## 2024-04-26 MED ORDER — AZELASTINE HCL 0.1 % NA SOLN: 2.0000 | Freq: Two times a day (BID) | NASAL | 0 refills | Status: AC

## 2024-04-26 MED ORDER — CETIRIZINE HCL 10 MG PO TABS: 10.0000 mg | ORAL_TABLET | Freq: Every day | ORAL | 0 refills | Status: AC

## 2024-04-26 MED ORDER — OLOPATADINE HCL 0.2 % OP SOLN: OPHTHALMIC | 0 refills | Status: AC

## 2024-04-26 NOTE — ED Provider Notes (Signed)
 " [facility]    CSN: [id number] Arrival date & time: 04/26/24  0859      History   Chief Complaint Chief Complaint  Patient presents with   Nasal Congestion    HPI Randy [name] is a 64 y.o. male.   The history is provided by the patient.   Patient presents with a 2-week history of redness of the eyes, itchy eyes, itchy throat, itching in the ears, runny nose, nasal congestion, and postnasal drainage.  Patient denies fever, chills, headache, ear pain, ear drainage, cough, abdominal pain, nausea, vomiting, diarrhea, or rash.  Patient reports underlying history of seasonal allergies.  States that he did try an over-the-counter allergy eyedrop for his symptoms.  He denies any obvious close sick contacts.  Past Medical History:  Diagnosis Date   HTN (hypertension)    Hyperlipidemia    Kidney stones    Leaky heart valve    small    Patient Active Problem List   Diagnosis Date Noted   Dyslipidemia 12/18/2022   History of rheumatic fever 12/18/2022   Lower urinary tract symptoms 12/18/2022   Mixed hyperlipidemia 10/29/2019   Chronic constipation 10/29/2019   Essential hypertension 02/11/2015    Past Surgical History:  Procedure Laterality Date   APPENDECTOMY     ORIF ZYGOMATIC FRACTURE Right        Home Medications    Prior to Admission medications  Medication Sig Start Date End Date Taking? Authorizing Provider  amLODipine  (NORVASC ) 10 MG tablet TAKE 1 TABLET BY MOUTH EVERY DAY 12/24/23   Purcell Emil Schanz, MD  atorvastatin  (LIPITOR) 20 MG tablet TAKE 1 TABLET BY MOUTH EVERY DAY 12/20/23   Sagardia, Miguel Jose, MD  clotrimazole  (LOTRIMIN ) 1 % cream Apply to affected area 2 times daily 01/27/24   Chandra Harlene DELENA, NP  fluticasone  (FLONASE ) 50 MCG/ACT nasal spray Place 2 sprays into both nostrils daily. Patient not taking: Reported on 12/18/2022 10/29/19   Melonie Colonel, Mikel HERO, MD  guaiFENesin  (MUCINEX ) 600 MG 12 hr tablet Take 1 tablet (600 mg  total) by mouth 2 (two) times daily. 05/09/23   Stuart Vernell Norris, PA-C  levocetirizine (XYZAL ) 5 MG tablet Take 1 tablet (5 mg total) by mouth every evening. Patient not taking: Reported on 12/18/2022 09/25/20   Arloa Suzen RAMAN, NP  Multiple Vitamin (MULTIVITAMIN) tablet Take 1 tablet by mouth daily. Patient not taking: Reported on 12/18/2022    [provider]  nystatin -triamcinolone  (MYCOLOG II) cream Apply to affected area daily 02/15/24   Leath-Warren, Etta PARAS, NP  polyethylene glycol powder (GLYCOLAX /MIRALAX ) 17 GM/SCOOP powder Take 17 g by mouth 2 (two) times daily as needed for moderate constipation. Patient not taking: Reported on 12/18/2022 10/29/19   Melonie Colonel, Mikel HERO, MD    Family History Family History  Problem Relation Age of Onset   Diabetes Mother    Hypertension Father    Kidney disease Father    Colon cancer Neg Hx    Stomach cancer Neg Hx    Rectal cancer Neg Hx    Esophageal cancer Neg Hx     Social History Social History[1]   Allergies   Patient has no known allergies.   Review of Systems Review of Systems Per HPI  Physical Exam Triage Vital Signs ED Triage Vitals [04/26/24 0929]  Encounter Vitals Group     BP (!) 163/96     Girls Systolic BP Percentile      Girls Diastolic BP Percentile  Boys Systolic BP Percentile      Boys Diastolic BP Percentile      Pulse Rate 91     Resp 16     Temp 98.4 F (36.9 C)     Temp Source Oral     SpO2 95 %     Weight      Height      Head Circumference      Peak Flow      Pain Score 0     Pain Loc      Pain Education      Exclude from Growth Chart    No data found.  Updated Vital Signs BP (!) 163/96 (BP Location: Right Arm)   Pulse 91   Temp 98.4 F (36.9 C) (Oral)   Resp 16   SpO2 95%   Visual Acuity Right Eye Distance:   Left Eye Distance:   Bilateral Distance:    Right Eye Near:   Left Eye Near:    Bilateral Near:     Physical Exam Vitals and nursing note  reviewed.  Constitutional:      General: He is not in acute distress.    Appearance: Normal appearance.  HENT:     Head: Normocephalic.     Right Ear: Tympanic membrane, ear canal and external ear normal.     Left Ear: Tympanic membrane, ear canal and external ear normal.     Nose: Congestion present.     Right Turbinates: Enlarged and swollen.     Left Turbinates: Enlarged and swollen.     Right Sinus: No maxillary sinus tenderness or frontal sinus tenderness.     Left Sinus: No maxillary sinus tenderness or frontal sinus tenderness.     Mouth/Throat:     Lips: Pink.     Mouth: Mucous membranes are moist.     Pharynx: Uvula midline. Postnasal drip present. No pharyngeal swelling, oropharyngeal exudate, posterior oropharyngeal erythema or uvula swelling.     Comments: Cobblestoning present to posterior oropharynx  Eyes:     General: Lids are normal. Vision grossly intact. Allergic shiner present.        Right eye: No foreign body, discharge or hordeolum.        Left eye: No foreign body, discharge or hordeolum.     Extraocular Movements: Extraocular movements intact.     Right eye: Normal extraocular motion and no nystagmus.     Left eye: Normal extraocular motion and no nystagmus.     Conjunctiva/sclera:     Right eye: Right conjunctiva is injected.     Left eye: Left conjunctiva is injected.     Pupils: Pupils are equal, round, and reactive to light.  Cardiovascular:     Rate and Rhythm: Normal rate and regular rhythm.     Pulses: Normal pulses.     Heart sounds: Normal heart sounds.  Pulmonary:     Effort: Pulmonary effort is normal. No respiratory distress.     Breath sounds: Normal breath sounds. No stridor. No wheezing, rhonchi or rales.  Musculoskeletal:     Cervical back: Normal range of motion.  Skin:    General: Skin is warm and dry.  Neurological:     General: No focal deficit present.     Mental Status: He is alert and oriented to person, place, and time.   Psychiatric:        Mood and Affect: Mood normal.        Behavior: Behavior normal.  UC Treatments / Results  Labs (all labs ordered are listed, but only abnormal results are displayed) Labs Reviewed - No data to display  EKG   Radiology No results found.  Procedures Procedures (including critical care time)  Medications Ordered in UC Medications - No data to display  Initial Impression / Assessment and Plan / UC Course  I have reviewed the triage vital signs and the nursing notes.  Pertinent labs & imaging results that were available during my care of the patient were reviewed by me and considered in my medical decision making (see chart for details).  Patient with itchy eyes, watery eyes, scratchy throat, scratchy ears, nasal congestion, and postnasal drainage for the past 2 weeks.  Symptoms are consistent with allergic rhinitis.  Will treat with cetirizine  10 mg, Pataday  eyedrops, and azelastine  nasal spray.  Supportive care recommendations were provided and discussed with the patient to include over-the-counter analgesics, cool compresses to the eyes, use of normal saline nasal spray, and to avoid allergy triggers.  Patient was given indications regarding follow-up.  Patient was in agreement with this plan of care and verbalizes understanding.  All questions were answered.  Patient stable for discharge.   Final Clinical Impressions(s) / UC Diagnoses   Final diagnoses:  None   Discharge Instructions   None    ED Prescriptions   None    PDMP not reviewed this encounter.     [1]  Social History Tobacco Use   Smoking status: Never   Smokeless tobacco: Never  Vaping Use   Vaping status: Never Used  Substance Use Topics   Alcohol use: No    Alcohol/week: 0.0 standard drinks of alcohol   Drug use: No     Gilmer Etta PARAS, NP 04/26/24 [phone]  "

## 2024-04-26 NOTE — ED Triage Notes (Addendum)
 Pt states runny nose and congestion for the past 2 weeks.  States he has itching to his ears,throat,nose and eyes.  Pts eyes are red. States he was using and allergy eye drop in them at home.

## 2024-04-26 NOTE — Discharge Instructions (Signed)
 Take medication as prescribed. Increase fluids and allow for plenty of rest. You may take over-the-counter Tylenol  as needed for pain, fever, or general discomfort. Recommend the use of normal saline nasal spray throughout the day for nasal congestion or runny nose. You may also use over-the-counter Visine or Clear Eyes eyedrops to keep the eyes moist and lubricated. Try to avoid allergy triggers to prevent worsening of symptoms. If symptoms fail to improve with this treatment, you may follow-up in this clinic or with your primary care physician for further evaluation. Follow-up as needed.
